# Patient Record
Sex: Male | Born: 1950 | Race: White | Hispanic: No | Marital: Married | Smoking: Never smoker
Health system: Southern US, Community
[De-identification: ages and names within clinical notes are randomized; demographics above are authoritative.]

## PROBLEM LIST (undated history)

## (undated) DIAGNOSIS — E785 Hyperlipidemia, unspecified: Secondary | ICD-10-CM

## (undated) HISTORY — DX: Hyperlipidemia, unspecified: E78.5

---
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
## 2007-01-29 ENCOUNTER — Emergency Department: Payer: Self-pay | Admitting: Emergency Medicine

## 2007-02-04 DIAGNOSIS — M5417 Radiculopathy, lumbosacral region: Secondary | ICD-10-CM | POA: Insufficient documentation

## 2007-02-07 ENCOUNTER — Ambulatory Visit: Payer: Self-pay | Admitting: Family Medicine

## 2010-11-11 ENCOUNTER — Ambulatory Visit: Payer: Self-pay

## 2010-11-20 ENCOUNTER — Emergency Department (HOSPITAL_COMMUNITY)
Admission: EM | Admit: 2010-11-20 | Discharge: 2010-11-20 | Disposition: A | Discharge status: Home / Self Care | Payer: Worker's Compensation | Attending: Emergency Medicine | Admitting: Emergency Medicine

## 2010-11-20 ENCOUNTER — Emergency Department (HOSPITAL_COMMUNITY): Payer: Worker's Compensation

## 2010-11-20 DIAGNOSIS — S6990XA Unspecified injury of unspecified wrist, hand and finger(s), initial encounter: Secondary | ICD-10-CM | POA: Insufficient documentation

## 2010-11-20 DIAGNOSIS — Y990 Civilian activity done for income or pay: Secondary | ICD-10-CM | POA: Insufficient documentation

## 2010-11-20 DIAGNOSIS — W230XXA Caught, crushed, jammed, or pinched between moving objects, initial encounter: Secondary | ICD-10-CM | POA: Insufficient documentation

## 2010-11-20 DIAGNOSIS — S61409A Unspecified open wound of unspecified hand, initial encounter: Secondary | ICD-10-CM | POA: Insufficient documentation

## 2011-02-05 ENCOUNTER — Emergency Department: Payer: Self-pay | Admitting: Emergency Medicine

## 2011-06-25 ENCOUNTER — Ambulatory Visit: Payer: Worker's Compensation

## 2011-06-25 ENCOUNTER — Other Ambulatory Visit: Payer: Self-pay | Admitting: Occupational Medicine

## 2011-06-25 DIAGNOSIS — R52 Pain, unspecified: Secondary | ICD-10-CM

## 2013-05-22 LAB — HM HEPATITIS C SCREENING LAB: HM HEPATITIS C SCREENING: NEGATIVE

## 2013-12-18 ENCOUNTER — Ambulatory Visit: Payer: Self-pay | Admitting: Gastroenterology

## 2013-12-18 LAB — HM COLONOSCOPY

## 2015-08-15 ENCOUNTER — Ambulatory Visit (INDEPENDENT_AMBULATORY_CARE_PROVIDER_SITE_OTHER): Payer: Medicare Other

## 2015-08-15 ENCOUNTER — Other Ambulatory Visit (INDEPENDENT_AMBULATORY_CARE_PROVIDER_SITE_OTHER): Payer: Medicare Other

## 2015-08-15 DIAGNOSIS — Z23 Encounter for immunization: Secondary | ICD-10-CM

## 2016-01-13 ENCOUNTER — Encounter: Payer: Self-pay | Admitting: Family Medicine

## 2016-01-13 ENCOUNTER — Ambulatory Visit (INDEPENDENT_AMBULATORY_CARE_PROVIDER_SITE_OTHER): Payer: Medicare Other | Admitting: Family Medicine

## 2016-01-13 VITALS — BP 122/82 | HR 67 | Temp 98.0°F | Resp 14 | Wt 258.4 lb

## 2016-01-13 DIAGNOSIS — N528 Other male erectile dysfunction: Secondary | ICD-10-CM | POA: Diagnosis not present

## 2016-01-13 DIAGNOSIS — L0591 Pilonidal cyst without abscess: Secondary | ICD-10-CM | POA: Diagnosis not present

## 2016-01-13 DIAGNOSIS — R0683 Snoring: Secondary | ICD-10-CM | POA: Insufficient documentation

## 2016-01-13 DIAGNOSIS — N429 Disorder of prostate, unspecified: Secondary | ICD-10-CM | POA: Insufficient documentation

## 2016-01-13 DIAGNOSIS — E785 Hyperlipidemia, unspecified: Secondary | ICD-10-CM

## 2016-01-13 DIAGNOSIS — N529 Male erectile dysfunction, unspecified: Secondary | ICD-10-CM

## 2016-01-13 MED ORDER — SILDENAFIL CITRATE 20 MG PO TABS: ORAL_TABLET | ORAL | Status: DC

## 2016-01-13 MED ORDER — CEPHALEXIN 500 MG PO CAPS: 500.0000 mg | ORAL_CAPSULE | Freq: Three times a day (TID) | ORAL | Status: DC

## 2016-01-13 NOTE — Progress Notes (Signed)
Patient ID: Walter [name], male   DOB: June 29, 1951, 66 y.o.   MRN: [id number]   Patient: Walter [name] Male    DOB: April 21, 1951   65 y.o.   MRN: [id number] Visit Date: 01/13/2016  Today's Provider: Dortha Kern, PA   Chief Complaint  Patient presents with  . Rash   Subjective:    Rash This is a new problem. The current episode started yesterday. The problem is unchanged. Location: lower mid back. The rash is characterized by redness and swelling (tenderness). He was exposed to an insect bite/sting. Associated symptoms include fatigue. Past treatments include nothing.  Some insect bites (possible small ticks) over the past 5-10 days.   Immunization History  Administered Date(s) Administered  . Influenza, High Dose Seasonal PF 08/15/2015  . Pneumococcal Conjugate-13 08/15/2015    Patient Active Problem List   Diagnosis Date Noted  . Disease of prostate 01/13/2016  . Snores 01/13/2016  . Herpes zoster without complication 04/25/2010  . 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 Colon, diverticulosis 12/13/2006   Past Surgical History  Procedure Laterality Date  . Tonsillectomy and adenoidectomy  1960   Family History  Problem Relation Age of Onset  . Hyperthyroidism Mother   . Lung cancer Father   . Heart attack Father   . Hypothyroidism Sister   . Coronary artery disease Brother   . Nephrolithiasis Brother   . Heart attack Maternal Grandfather   . Heart disease Paternal Grandmother   . Emphysema Paternal Grandfather     Previous Medications   CINNAMON 500 MG CAPSULE    Take by mouth.   COENZYME Q10 (COQ-10) 10 MG CAPS    Take by mouth.   CYANOCOBALAMIN 100 MCG TABLET    Take by mouth.   MAGNESIUM PO    Take by mouth.   NAPROXEN SODIUM (ALEVE) 220 MG TABLET    Take by mouth.   OMEGA-3 FATTY ACIDS PO    Take by mouth.   RED YEAST RICE EXTRACT (RED YEAST RICE PO)    Take by mouth.   SILDENAFIL (REVATIO) 20 MG TABLET    Take by mouth.   UNABLE TO FIND    Med Name:osteo bio   VITAMIN C (ASCORBIC ACID) 500 MG TABLET    Take by mouth.   VITAMIN D, CHOLECALCIFEROL, 400 UNITS TABS    Take by mouth.   VITAMIN E 1000 UNIT CAPSULE    Take by mouth.   Allergies  Allergen Reactions  . Antihistamines, Chlorpheniramine-Type     Review of Systems  Constitutional: Positive for fatigue.  HENT: Negative.   Eyes: Negative.   Respiratory: Negative.   Cardiovascular: Negative.   Gastrointestinal: Negative.   Endocrine: Negative.   Genitourinary: Negative.   Musculoskeletal: Negative.   Skin: Positive for rash.  Allergic/Immunologic: Negative.   Neurological: Negative.   Hematological: Negative.   Psychiatric/Behavioral: Negative.     Social History  Substance Use Topics  . Smoking status: Never Smoker   . Smokeless tobacco: Never Used  . Alcohol Use: 0.0 oz/week    0 Standard drinks or equivalent per week     Comment: occasionally   Objective:   BP 122/82 mmHg  Pulse 67  Temp(Src) 98 F (36.7 C) (Oral)  Resp 14  Wt 258 lb 6.4 oz (117.209 kg)  Physical Exam  Constitutional: He is oriented to person, place, and time. He appears well-developed and well-nourished. No distress.  HENT:  Head: Normocephalic and atraumatic.  Right Ear: Hearing normal.  Left Ear: Hearing normal.  Nose: Nose normal.  Eyes: Conjunctivae and lids are normal. Right eye exhibits no discharge. Left eye exhibits no discharge. No scleral icterus.  Cardiovascular: Normal rate and regular rhythm.   Pulmonary/Chest: Effort normal and breath sounds normal. No respiratory distress.  Musculoskeletal: Normal range of motion.  Neurological: He is alert and oriented to person, place, and time.  Skin: Skin is intact. No lesion noted.  Small insect bites on right leg. Hot swelling with erythema just above buttock cleft over sacrum. Some tenderness without drainage or dimpling.  Psychiatric: He has a  normal mood and affect. His speech is normal and behavior is normal. Thought content normal.      Assessment & Plan:     1. Infected pilonidal cyst Onset over the past few days without fever or drainage. Will check CBC with differential and start Keflex for infection. Recommend hot Epsom Saltwater soaks at home and recheck pending lab reports. - CBC with Differential/Platelet - cephALEXin (KEFLEX) 500 MG capsule; Take 1 capsule (500 mg total) by mouth 3 (three) times daily.  Dispense: 21 capsule; Refill: 0  2. Hyperlipidemia Still using Red Yeast Rice and Fish Oil supplement with attempts to follow low fat diet. Recheck CMP and Lipid panel. Due to schedule CPE in the next 2-3 months. - Comprehensive metabolic panel - Lipid panel  3. ED (erectile dysfunction) of organic origin Requests refill of sildenafil. Finds it is becoming less effective in helping with maintaining or attaining erections. Will refill as below and recommend he schedule for CPE. - sildenafil (REVATIO) 20 MG tablet; Take 2-3 tablets by mouth 1-4 hours prior to intercourse.  Dispense: 30 tablet; Refill: 3

## 2016-01-13 NOTE — Patient Instructions (Signed)
Pilonidal Cyst  A pilonidal cyst is a fluid-filled sac. It forms beneath the skin near your tailbone, at the top of the crease of your buttocks. A pilonidal cyst that is not large or infected may not cause symptoms or problems.  If the cyst becomes irritated or infected, it may fill with pus. This causes pain and swelling (pilonidal abscess). An infected cyst may need to be treated with medicine, drained, or removed.  CAUSES  The cause of a pilonidal cyst is not known. One cause may be a hair that grows into your skin (ingrown hair).  RISK FACTORS  Pilonidal cysts are more common in boys and men. Risk factors include:  · Having lots of hair near the crease of the buttocks.  · Being overweight.  · Having a pilonidal dimple.  · Wearing tight clothing.  · Not bathing or showering frequently.  · Sitting for long periods of time.  SIGNS AND SYMPTOMS  Signs and symptoms of a pilonidal cyst may include:  · Redness.  · Pain and tenderness.  · Warmth.  · Swelling.  · Pus.  · Fever.  DIAGNOSIS  Your health care provider may diagnose a pilonidal cyst based on your symptoms and a physical exam. The health care provider may do a blood test to check for infection. If your cyst is draining pus, your health care provider may take a sample of the drainage to be tested at a laboratory.  TREATMENT  Surgery is the usual treatment for an infected pilonidal cyst. You may also have to take medicines before surgery. The type of surgery you have depends on the size and severity of the infected cyst. The different kinds of surgery include:  · Incision and drainage. This is a procedure to open and drain the cyst.  · Marsupialization. In this procedure, a large cyst or abscess may be opened and kept open by stitching the edges of the skin to the cyst walls.  · Cyst removal. This procedure involves opening the skin and removing all or part of the cyst.  HOME CARE INSTRUCTIONS  · Follow all of your surgeon's instructions carefully if you had  surgery.  · Take medicines only as directed by your health care provider.  · If you were prescribed an antibiotic medicine, finish it all even if you start to feel better.  · Keep the area around your pilonidal cyst clean and dry.  · Clean the area as directed by your health care provider. Pat the area dry with a clean towel. Do not rub it as this may cause bleeding.  · Remove hair from the area around the cyst as directed by your health care provider.  · Do not wear tight clothing or sit in one place for long periods of time.  · There are many different ways to close and cover an incision, including stitches, skin glue, and adhesive strips. Follow your health care provider's instructions on:    Incision care.    Bandage (dressing) changes and removal.    Incision closure removal.  SEEK MEDICAL CARE IF:   · You have drainage, redness, swelling, or pain at the site of the cyst.  · You have a fever.     This information is not intended to replace advice given to you by your health care provider. Make sure you discuss any questions you have with your health care provider.     Document Released: 09/11/2000 Document Revised: 10/05/2014 Document Reviewed: 02/01/2014  Elsevier Interactive Patient   Education ©2016 Else[employer] Inc[employer]  -

## 2016-01-14 ENCOUNTER — Telehealth: Payer: Self-pay

## 2016-01-14 LAB — CBC WITH DIFFERENTIAL/PLATELET
BASOS ABS: 0 10*3/uL (ref 0.0–0.2)
BASOS: 1 %
EOS (ABSOLUTE): 0.3 10*3/uL (ref 0.0–0.4)
Eos: 6 %
Hematocrit: 45 % (ref 37.5–51.0)
Hemoglobin: 15.5 g/dL (ref 12.6–17.7)
IMMATURE GRANS (ABS): 0 10*3/uL (ref 0.0–0.1)
IMMATURE GRANULOCYTES: 0 %
LYMPHS: 31 %
Lymphocytes Absolute: 1.7 10*3/uL (ref 0.7–3.1)
MCH: 32 pg (ref 26.6–33.0)
MCHC: 34.4 g/dL (ref 31.5–35.7)
MCV: 93 fL (ref 79–97)
MONOS ABS: 0.8 10*3/uL (ref 0.1–0.9)
Monocytes: 14 %
NEUTROS PCT: 48 %
Neutrophils Absolute: 2.6 10*3/uL (ref 1.4–7.0)
PLATELETS: 205 10*3/uL (ref 150–379)
RBC: 4.84 x10E6/uL (ref 4.14–5.80)
RDW: 13.1 % (ref 12.3–15.4)
WBC: 5.5 10*3/uL (ref 3.4–10.8)

## 2016-01-14 LAB — COMPREHENSIVE METABOLIC PANEL
A/G RATIO: 1.8 (ref 1.2–2.2)
ALT: 22 IU/L (ref 0–44)
AST: 25 IU/L (ref 0–40)
Albumin: 4.6 g/dL (ref 3.6–4.8)
Alkaline Phosphatase: 77 IU/L (ref 39–117)
BILIRUBIN TOTAL: 0.7 mg/dL (ref 0.0–1.2)
BUN/Creatinine Ratio: 13 (ref 10–24)
BUN: 11 mg/dL (ref 8–27)
CALCIUM: 9.2 mg/dL (ref 8.6–10.2)
CHLORIDE: 99 mmol/L (ref 96–106)
CO2: 22 mmol/L (ref 18–29)
Creatinine, Ser: 0.84 mg/dL (ref 0.76–1.27)
GFR calc Af Amer: 106 mL/min/{1.73_m2} (ref >59)
GFR calc non Af Amer: 92 mL/min/{1.73_m2} (ref >59)
GLUCOSE: 88 mg/dL (ref 65–99)
Globulin, Total: 2.5 g/dL (ref 1.5–4.5)
POTASSIUM: 5.6 mmol/L — AB (ref 3.5–5.2)
Sodium: 138 mmol/L (ref 134–144)
Total Protein: 7.1 g/dL (ref 6.0–8.5)

## 2016-01-14 LAB — LIPID PANEL
Chol/HDL Ratio: 3.1 ratio units (ref 0.0–5.0)
Cholesterol, Total: 166 mg/dL (ref 100–199)
HDL: 53 mg/dL (ref >39)
LDL Calculated: 105 mg/dL — ABNORMAL HIGH (ref 0–99)
TRIGLYCERIDES: 41 mg/dL (ref 0–149)
VLDL CHOLESTEROL CAL: 8 mg/dL (ref 5–40)

## 2016-01-14 NOTE — Telephone Encounter (Signed)
Advised patient as below. He will call back to schedule appt.

## 2016-01-14 NOTE — Telephone Encounter (Signed)
Left message to call back  

## 2016-01-14 NOTE — Telephone Encounter (Signed)
-----   Message from Tamsen Roersennis E Chrismon, [place] sent at 01/14/2016  1:24 PM EDT ----- All blood tests normal except potassium slightly elevated. Normal cholesterol and no sign of anemia. Finish all the antibiotic. Recheck in 4-5 days to be sure infection clearing.

## 2016-01-17 ENCOUNTER — Encounter: Payer: Self-pay | Admitting: Family Medicine

## 2016-01-17 ENCOUNTER — Ambulatory Visit (INDEPENDENT_AMBULATORY_CARE_PROVIDER_SITE_OTHER): Payer: Medicare Other | Admitting: Family Medicine

## 2016-01-17 VITALS — BP 130/80 | HR 68 | Temp 97.9°F | Resp 14 | Wt 253.8 lb

## 2016-01-17 DIAGNOSIS — L0591 Pilonidal cyst without abscess: Secondary | ICD-10-CM

## 2016-01-17 NOTE — Progress Notes (Signed)
Patient ID: Walter [name], male   DOB: 1951/01/25, 65 y.o.   MRN: [id number]   Patient: Walter [name] Male    DOB: 1951/01/25   65 y.o.   MRN: [id number] Visit Date: 01/17/2016  Today's Provider: Dortha Kernennis Chrismon, PA   Chief Complaint  Patient presents with  . Follow-up   Subjective:    HPI Patient presents for a 4 day follow up. Patient was seen on 01/13/2016 and diagnosed with a infected pilonidal cyst. Patient was prescribed Cephalexin. Patient reports symptoms have improved. Patient is still taking antibiotics.    Previous Medications   CEPHALEXIN (KEFLEX) 500 MG CAPSULE    Take 1 capsule (500 mg total) by mouth 3 (three) times daily.   CINNAMON 500 MG CAPSULE    Take by mouth.   COENZYME Q10 (COQ-10) 10 MG CAPS    Take by mouth.   CYANOCOBALAMIN 100 MCG TABLET    Take by mouth.   MAGNESIUM PO    Take by mouth.   NAPROXEN SODIUM (ALEVE) 220 MG TABLET    Take by mouth.   OMEGA-3 FATTY ACIDS PO    Take by mouth.   RED YEAST RICE EXTRACT (RED YEAST RICE PO)    Take by mouth.   SILDENAFIL (REVATIO) 20 MG TABLET    Take 2-3 tablets by mouth 1-4 hours prior to intercourse.   UNABLE TO FIND    Med Name:osteo bio   VITAMIN C (ASCORBIC ACID) 500 MG TABLET    Take by mouth.   VITAMIN D, CHOLECALCIFEROL, 400 UNITS TABS    Take by mouth.   VITAMIN E 1000 UNIT CAPSULE    Take by mouth.   Allergies  Allergen Reactions  . Antihistamines, Chlorpheniramine-Type     Review of Systems  Constitutional: Negative.   HENT: Negative.   Eyes: Negative.   Respiratory: Negative.   Cardiovascular: Negative.   Gastrointestinal: Negative.   Endocrine: Negative.   Genitourinary: Negative.   Musculoskeletal: Negative.   Skin:       Pilonidal cyst   Allergic/Immunologic: Negative.   Neurological: Negative.   Hematological: Negative.   Psychiatric/Behavioral: Negative.     Social History  Substance Use Topics  . Smoking status: Never Smoker   . Smokeless tobacco: Never Used  . Alcohol Use:  0.0 oz/week    0 Standard drinks or equivalent per week     Comment: occasionally   Objective:   BP 130/80 mmHg  Pulse 68  Temp(Src) 97.9 F (36.6 C) (Oral)  Resp 14  Wt 253 lb 12.8 oz (115.123 kg)  Physical Exam  Constitutional: He is oriented to person, place, and time. He appears well-developed and well-nourished. No distress.  HENT:  Head: Normocephalic and atraumatic.  Right Ear: Hearing normal.  Left Ear: Hearing normal.  Nose: Nose normal.  Eyes: Conjunctivae and lids are normal. Right eye exhibits no discharge. Left eye exhibits no discharge. No scleral icterus.  Pulmonary/Chest: Effort normal. No respiratory distress.  Musculoskeletal: Normal range of motion.  Neurological: He is alert and oriented to person, place, and time.  Skin: Skin is intact. No lesion and no rash noted.  Psychiatric: He has a normal mood and affect. His speech is normal and behavior is normal. Thought content normal.      Assessment & Plan:     1. Infected pilonidal cyst Greatly improved and to palpable lesion now. All the redness, pain and swelling gone since using Keflex. Finish all the antibiotic and recheck prn.

## 2016-01-20 ENCOUNTER — Ambulatory Visit (INDEPENDENT_AMBULATORY_CARE_PROVIDER_SITE_OTHER): Payer: Medicare Other | Admitting: Family Medicine

## 2016-01-20 ENCOUNTER — Encounter: Payer: Self-pay | Admitting: Family Medicine

## 2016-01-20 VITALS — BP 134/86 | HR 67 | Temp 97.9°F | Resp 14 | Wt 256.0 lb

## 2016-01-20 DIAGNOSIS — T148 Other injury of unspecified body region: Secondary | ICD-10-CM | POA: Diagnosis not present

## 2016-01-20 DIAGNOSIS — W57XXXA Bitten or stung by nonvenomous insect and other nonvenomous arthropods, initial encounter: Secondary | ICD-10-CM | POA: Diagnosis not present

## 2016-01-20 NOTE — Progress Notes (Signed)
Patient ID: Walter [name], male   DOB: 05-15-1951, 65 y.o.   MRN: [id number]   Patient: Walter [name] Male    DOB: 11/09/1950   65 y.o.   MRN: [id number] Visit Date: 01/20/2016  Today's Provider: Dortha Kern, PA   Chief Complaint  Patient presents with  . Insect Bite   Subjective:    HPI Tick Bite:   Patient reports removing a tick from right side groin area. Patient states area is red with a bump now. Patient wants to be sure the tick's whole body was removed. Patient denies itching, pain, fever and headache.   Previous Medications   CEPHALEXIN (KEFLEX) 500 MG CAPSULE    Take 1 capsule (500 mg total) by mouth 3 (three) times daily.   CINNAMON 500 MG CAPSULE    Take by mouth.   COENZYME Q10 (COQ-10) 10 MG CAPS    Take by mouth.   CYANOCOBALAMIN 100 MCG TABLET    Take by mouth.   MAGNESIUM PO    Take by mouth.   NAPROXEN SODIUM (ALEVE) 220 MG TABLET    Take by mouth.   OMEGA-3 FATTY ACIDS PO    Take by mouth.   RED YEAST RICE EXTRACT (RED YEAST RICE PO)    Take by mouth.   SILDENAFIL (REVATIO) 20 MG TABLET    Take 2-3 tablets by mouth 1-4 hours prior to intercourse.   UNABLE TO FIND    Med Name:osteo bio   VITAMIN C (ASCORBIC ACID) 500 MG TABLET    Take by mouth.   VITAMIN D, CHOLECALCIFEROL, 400 UNITS TABS    Take by mouth.   VITAMIN E 1000 UNIT CAPSULE    Take by mouth.   Allergies  Allergen Reactions  . Antihistamines, Chlorpheniramine-Type     Review of Systems  Constitutional: Negative.   HENT: Negative.   Eyes: Negative.   Respiratory: Negative.   Cardiovascular: Negative.   Gastrointestinal: Negative.   Endocrine: Negative.   Genitourinary: Negative.   Musculoskeletal: Negative.   Skin: Negative.   Allergic/Immunologic: Negative.   Neurological: Negative.   Hematological: Negative.   Psychiatric/Behavioral: Negative.     Social History  Substance Use Topics  . Smoking status: Never Smoker   . Smokeless tobacco: Never Used  . Alcohol Use: 0.0 oz/week     0 Standard drinks or equivalent per week     Comment: occasionally   Objective:   BP 134/86 mmHg  Pulse 67  Temp(Src) 97.9 F (36.6 C) (Oral)  Resp 14  Wt 256 lb (116.121 kg)  Physical Exam  Constitutional: He is oriented to person, place, and time. He appears well-developed and well-nourished. No distress.  HENT:  Head: Normocephalic and atraumatic.  Right Ear: Hearing normal.  Left Ear: Hearing normal.  Nose: Nose normal.  Eyes: Conjunctivae and lids are normal. Right eye exhibits no discharge. Left eye exhibits no discharge. No scleral icterus.  Pulmonary/Chest: Effort normal. No respiratory distress.  Musculoskeletal: Normal range of motion.  Neurological: He is alert and oriented to person, place, and time.  Skin: Skin is intact. No lesion noted.  1 cm red spot on the right groin without local lymphadenopathy.  Psychiatric: He has a normal mood and affect. His speech is normal and behavior is normal. Thought content normal.      Assessment & Plan:     1. Tick bite  Found tick attached to skin in right groin two days ago. No fever or local lymphadenopathy. Slight red spot  a bite site. Cleaned it well with alcohol and no evidence of tick parts still in skin. Brought tick with him and all mouth parts seem to be intact. Recheck if any flu-like symptoms.

## 2016-01-20 NOTE — Patient Instructions (Signed)
Tick Bite Information Ticks are insects that attach themselves to the skin and draw blood for food. There are various types of ticks. Common types include wood ticks and deer ticks. Most ticks live in shrubs and grassy areas. Ticks can climb onto your body when you make contact with leaves or grass where the tick is waiting. The most common places on the body for ticks to attach themselves are the scalp, neck, armpits, waist, and groin. Most tick bites are harmless, but sometimes ticks carry germs that cause diseases. These germs can be spread to a person during the tick's feeding process. The chance of a disease spreading through a tick bite depends on:   The type of tick.  Time of year.   How long the tick is attached.   Geographic location.  HOW CAN YOU PREVENT TICK BITES? Take these steps to help prevent tick bites when you are outdoors:  Wear protective clothing. Long sleeves and long pants are best.   Wear white clothes so you can see ticks more easily.  Tuck your pant legs into your socks.   If walking on a trail, stay in the middle of the trail to avoid brushing against bushes.  Avoid walking through areas with long grass.  Put insect repellent on all exposed skin and along boot tops, pant legs, and sleeve cuffs.   Check clothing, hair, and skin repeatedly and before going inside.   Brush off any ticks that are not attached.  Take a shower or bath as soon as possible after being outdoors.  WHAT IS THE PROPER WAY TO REMOVE A TICK? Ticks should be removed as soon as possible to help prevent diseases caused by tick bites. 1. If latex gloves are available, put them on before trying to remove a tick.  2. Using fine-point tweezers, grasp the tick as close to the skin as possible. You may also use curved forceps or a tick removal tool. Grasp the tick as close to its head as possible. Avoid grasping the tick on its body. 3. Pull gently with steady upward pressure until  the tick lets go. Do not twist the tick or jerk it suddenly. This may break off the tick's head or mouth parts. 4. Do not squeeze or crush the tick's body. This could force disease-carrying fluids from the tick into your body.  5. After the tick is removed, wash the bite area and your hands with soap and water or other disinfectant such as alcohol. 6. Apply a small amount of antiseptic cream or ointment to the bite site.  7. Wash and disinfect any instruments that were used.  Do not try to remove a tick by applying a hot match, petroleum jelly, or fingernail polish to the tick. These methods do not work and may increase the chances of disease being spread from the tick bite.  WHEN SHOULD YOU SEEK MEDICAL CARE? Contact your health care provider if you are unable to remove a tick from your skin or if a part of the tick breaks off and is stuck in the skin.  After a tick bite, you need to be aware of signs and symptoms that could be related to diseases spread by ticks. Contact your health care provider if you develop any of the following in the days or weeks after the tick bite:  Unexplained fever.  Rash. A circular rash that appears days or weeks after the tick bite may indicate the possibility of Lyme disease. The rash may resemble   a target with a bull's-eye and may occur at a different part of your body than the tick bite.  Redness and swelling in the area of the tick bite.   Tender, swollen lymph glands.   Diarrhea.   Weight loss.   Cough.   Fatigue.   Muscle, joint, or bone pain.   Abdominal pain.   Headache.   Lethargy or a change in your level of consciousness.  Difficulty walking or moving your legs.   Numbness in the legs.   Paralysis.  Shortness of breath.   Confusion.   Repeated vomiting.    This information is not intended to replace advice given to you by your health care provider. Make sure you discuss any questions you have with your health  care provider.   Document Released: 09/11/2000 Document Revised: 10/05/2014 Document Reviewed: 02/22/2013 Elsevier Interactive Patient Education 2016 Elsevier Inc.  

## 2016-05-01 ENCOUNTER — Encounter: Payer: Self-pay | Admitting: Family Medicine

## 2016-05-01 ENCOUNTER — Ambulatory Visit (INDEPENDENT_AMBULATORY_CARE_PROVIDER_SITE_OTHER): Payer: Medicare Other | Admitting: Family Medicine

## 2016-05-01 VITALS — BP 132/70 | HR 80 | Temp 98.3°F | Resp 16 | Wt 253.0 lb

## 2016-05-01 DIAGNOSIS — S61432A Puncture wound without foreign body of left hand, initial encounter: Secondary | ICD-10-CM

## 2016-05-01 DIAGNOSIS — Z23 Encounter for immunization: Secondary | ICD-10-CM

## 2016-05-01 MED ORDER — TETANUS-DIPHTHERIA TOXOIDS TD 5-2 LFU IM INJ: 0.5000 mL | INJECTION | Freq: Once | INTRAMUSCULAR | Status: DC

## 2016-05-01 NOTE — Progress Notes (Signed)
Patient: Walter [name] Male    DOB: [birth year]   65 y.o.   MRN: [id number] Visit Date: 05/01/2016  Today's Provider: Dortha Kern, PA   Chief Complaint  Patient presents with  . rusty nail in right hand   Subjective:    HPI Patient comes in today c/o a rusty nail puncturing his right hand. Patient reports that he is unsure of when his last tetanus shot was. Patient would like to update tetanus shot today.    No past medical history on file. Patient Active Problem List   Diagnosis Date Noted  . Disease of prostate 01/13/2016  . Snores 01/13/2016  . Hyperlipidemia 01/13/2016  . Herpes zoster without complication 04/25/2010  . 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 Colon, diverticulosis 12/13/2006   Past Surgical History:  Procedure Laterality Date  . TONSILLECTOMY AND ADENOIDECTOMY  1960    Allergies  Allergen Reactions  . Antihistamines, Chlorpheniramine-Type    Current Meds  Medication Sig  . Cinnamon 500 MG capsule Take by mouth.  . Coenzyme Q10 (COQ-10) 10 MG CAPS Take by mouth.  . cyanocobalamin 100 MCG tablet Take by mouth.  [name] MAGNESIUM PO Take by mouth.  . naproxen sodium (ALEVE) 220 MG tablet Take by mouth.  . OMEGA-3 FATTY ACIDS PO Take by mouth.  . Red Yeast Rice Extract (RED YEAST RICE PO) Take by mouth.  . vitamin C (ASCORBIC ACID) 500 MG tablet Take by mouth.  . Vitamin D, Cholecalciferol, 400 units TABS Take by mouth.  . vitamin E 1000 UNIT capsule Take by mouth.    Review of Systems  Constitutional: Negative.   Respiratory: Negative.   Cardiovascular: Negative.   Skin: Positive for color change.       Redness around the area where nail punctured the skin (on right hand).   Neurological: Negative.     Social History  Substance Use Topics  . Smoking status: Never Smoker  . Smokeless tobacco: Never Used  . Alcohol use 0.0 oz/week     Comment:  occasionally   Objective:   BP 132/70 (BP Location: Right Arm, Patient Position: Sitting, Cuff Size: Normal)   Pulse 80   Temp 98.3 F (36.8 C)   Resp 16   Wt 253 lb (114.8 kg)  Wt Readings from Last 3 Encounters:  05/01/16 253 lb (114.8 kg)  01/20/16 256 lb (116.1 kg)  01/17/16 253 lb 12.8 oz (115.1 kg)    Physical Exam  Constitutional: He is oriented to person, place, and time. He appears well-developed and well-nourished. No distress.  HENT:  Head: Normocephalic and atraumatic.  Right Ear: Hearing normal.  Left Ear: Hearing normal.  Nose: Nose normal.  Eyes: Conjunctivae and lids are normal. Right eye exhibits no discharge. Left eye exhibits no discharge. No scleral icterus.  Cardiovascular: Normal rate and regular rhythm.   Pulmonary/Chest: Effort normal and breath sounds normal. No respiratory distress.  Abdominal: Soft. Bowel sounds are normal.  Musculoskeletal: Normal range of motion.  Neurological: He is alert and oriented to person, place, and time.  Skin: Skin is intact. No lesion and no rash noted.  Small puncture wounds on the right hand from rusty nails on the front porch at home.  Psychiatric: He has a normal mood and affect. His speech is normal and behavior is normal. Thought content normal.      Assessment & Plan:  1. Puncture wound of left hand, initial encounter Stuck rusty nail in the right hand twice today at home working on the railing of his front porch. No local lymphadenopathy, lymphangitis or purulent drainage. Will give Td booster and may use warm Epsom Saltwater soaks. Recheck prn. - Tetanus vaccine IM       Dortha Kern, PA  [facility]

## 2016-05-01 NOTE — Patient Instructions (Signed)
Puncture Wound A puncture wound is an injury that is caused by a sharp, thin object that goes through (penetrates) your skin. Usually, a puncture wound does not leave a large opening in your skin, so it may not bleed a lot. However, when you get a puncture wound, dirt or other materials (foreign bodies) can be forced into your wound and break off inside. This increases the chance of infection, such as tetanus. CAUSES Puncture wounds are caused by any sharp, thin object that goes through your skin, such as:  Animal teeth, as with an animal bite.  Sharp, pointed objects, such as nails, splinters of glass, fishhooks, and needles. SYMPTOMS Symptoms of a puncture wound include:  Pain.  Bleeding.  Swelling.  Bruising.  Fluid leaking from the wound.  Numbness, tingling, or loss of function. DIAGNOSIS This condition is diagnosed with a medical history and physical exam. Your wound will be checked to see if it contains any foreign bodies. You may also have X-rays or other imaging tests. TREATMENT Treatment for a puncture wound depends on how serious the wound is. It also depends on whether the wound contains any foreign bodies. Treatment for all types of puncture wounds usually starts with:  Controlling the bleeding.  Washing out the wound with a germ-free (sterile) salt-water solution.  Checking the wound for foreign bodies. Treatment may also include:  Having the wound opened surgically to remove a foreign object.  Closing the wound with stitches (sutures) if it continues to bleed.  Covering the wound with antibiotic ointments and a bandage (dressing).  Receiving a tetanus shot.  Receiving a rabies vaccine. HOME CARE INSTRUCTIONS Medicines  Take or apply over-the-counter and prescription medicines only as told by your health care provider.  If you were prescribed an antibiotic, take or apply it as told by your health care provider. Do not stop using the antibiotic even if  your condition improves. Wound Care  There are many ways to close and cover a wound. For example, a wound can be covered with sutures, skin glue, or adhesive strips. Follow instructions from your health care provider about:  How to take care of your wound.  When and how you should change your dressing.  When you should remove your dressing.  Removing whatever was used to close your wound.  Keep the dressing dry as told by your health care provider. Do not take baths, swim, use a hot tub, or do anything that would put your wound underwater until your health care provider approves.  Clean the wound as told by your health care provider.  Do not scratch or pick at the wound.  Check your wound every day for signs of infection. Watch for:  Redness, swelling, or pain.  Fluid, blood, or pus. General Instructions  Raise (elevate) the injured area above the level of your heart while you are sitting or lying down.  If your puncture wound is in your foot, ask your health care provider if you need to avoid putting weight on your foot and for how long.  Keep all follow-up visits as told by your health care provider. This is important. SEEK MEDICAL CARE IF:  You received a tetanus shot and you have swelling, severe pain, redness, or bleeding at the injection site.  You have a fever.  Your sutures come out.  You notice a bad smell coming from your wound or your dressing.  You notice something coming out of your wound, such as wood or glass.  Your   pain is not controlled with medicine.  You have increased redness, swelling, or pain at the site of your wound.  You have fluid, blood, or pus coming from your wound.  You notice a change in the color of your skin near your wound.  You need to change the dressing frequently due to fluid, blood, or pus draining from your wound.  You develop a new rash.  You develop numbness around your wound. SEEK IMMEDIATE MEDICAL CARE IF:  You  develop severe swelling around your wound.  Your pain suddenly increases and is severe.  You develop painful skin lumps.  You have a red streak going away from your wound.  The wound is on your hand or foot and you cannot properly move a finger or toe.  The wound is on your hand or foot and you notice that your fingers or toes look pale or bluish.   This information is not intended to replace advice given to you by your health care provider. Make sure you discuss any questions you have with your health care provider.   Document Released: 06/24/2005 Document Revised: 06/05/2015 Document Reviewed: 11/07/2014 Elsevier Interactive Patient Education 2016 [employer] Inc[employer]  

## 2016-06-25 ENCOUNTER — Ambulatory Visit (INDEPENDENT_AMBULATORY_CARE_PROVIDER_SITE_OTHER): Payer: Medicare Other | Admitting: Family Medicine

## 2016-06-25 DIAGNOSIS — Z23 Encounter for immunization: Secondary | ICD-10-CM | POA: Diagnosis not present

## 2016-08-13 ENCOUNTER — Ambulatory Visit (INDEPENDENT_AMBULATORY_CARE_PROVIDER_SITE_OTHER): Payer: Medicare Other | Admitting: Family Medicine

## 2016-08-13 VITALS — BP 136/72 | HR 64 | Temp 97.9°F | Ht 72.25 in | Wt 251.5 lb

## 2016-08-13 DIAGNOSIS — Z0000 Encounter for general adult medical examination without abnormal findings: Secondary | ICD-10-CM | POA: Diagnosis not present

## 2016-08-13 NOTE — Progress Notes (Signed)
Subjective:   Walter [name] is a 65 y.o. male who presents for Medicare Annual/Subsequent preventive examination.  Review of Systems:  N/A  Cardiac Risk Factors include: advanced age (>5655men, 3>65 women);dyslipidemia;male gender;obesity (BMI >30kg/m2)     Objective:    Vitals: BP 136/72 (BP Location: Right Arm)   Pulse 64   Temp 97.9 F (36.6 C) (Oral)   Ht 6' 0.25" (1.835 m)   Wt 251 lb 8 oz (114.1 kg)   BMI 33.87 kg/m   Body mass index is 33.87 kg/m.  Tobacco History  Smoking Status  . Never Smoker  Smokeless Tobacco  . Never Used     Counseling given: Not Answered   History reviewed. No pertinent past medical history. Past Surgical History:  Procedure Laterality Date  . TONSILLECTOMY AND ADENOIDECTOMY  1960   Family History  Problem Relation Age of Onset  . Hyperthyroidism Mother   . Lung cancer Father   . Heart attack Father   . Hypothyroidism Sister   . Coronary artery disease Brother   . Nephrolithiasis Brother   . Heart attack Maternal Grandfather   . Heart disease Paternal Grandmother   . Emphysema Paternal Grandfather    History  Sexual Activity  . Sexual activity: Not on file    Outpatient Encounter Prescriptions as of 08/13/2016  Medication Sig  . ASHWAGANDHA PO Take by mouth.  . Cinnamon 500 MG capsule Take by mouth.  . Coenzyme Q10 (COQ-10) 10 MG CAPS Take by mouth.  . Cyanocobalamin (VITAMIN B-12) 2500 MCG SUBL Place 2 each under the tongue daily.  [name]. MAGNESIUM PO Take by mouth.  . milk thistle 175 MG tablet Take 175 mg by mouth daily.  . naproxen sodium (ALEVE) 220 MG tablet Take by mouth.  . OMEGA-3 FATTY ACIDS PO Take by mouth.  . Red Yeast Rice Extract (RED YEAST RICE PO) Take by mouth.  [name]. UNABLE TO FIND daily. Med Name:osteo bio   . vitamin C (ASCORBIC ACID) 500 MG tablet Take by mouth.   . Vitamin D, Cholecalciferol, 400 units TABS Take by mouth.   . vitamin E 1000 UNIT capsule Take by mouth.  . cyanocobalamin 100 MCG tablet  Take by mouth.  . sildenafil (REVATIO) 20 MG tablet Take 2-3 tablets by mouth 1-4 hours prior to intercourse. (Patient not taking: Reported on 08/13/2016)   No facility-administered encounter medications on file as of 08/13/2016.     Activities of Daily Living In your present state of health, do you have any difficulty performing the following activities: 08/13/2016  Hearing? N  Vision? N  Difficulty concentrating or making decisions? N  Walking or climbing stairs? N  Dressing or bathing? N  Doing errands, shopping? N  Preparing Food and eating ? N  Using the Toilet? N  In the past six months, have you accidently leaked urine? N  Do you have problems with loss of bowel control? N  Managing your Medications? N  Managing your Finances? N  Housekeeping or managing your Housekeeping? N  Some recent data might be hidden    Patient Care Team: Tamsen Roersennis E Chrismon, PA as PCP - General (Family Medicine)   Assessment:     Exercise Activities and Dietary recommendations Current Exercise Habits: The patient does not participate in regular exercise at present;Home exercise routine (yard work, remodeling home), Type of exercise: walking, Time (Minutes): > 60, Frequency (Times/Week): 7, Weekly Exercise (Minutes/Week): 0, Intensity: Mild  Goals    . Increase water intake  Starting 08/13/16, I will increase my water intake to 5-6 glasses a day.      Fall Risk Fall Risk  08/13/2016  Falls in the past year? No   Depression Screen PHQ 2/9 Scores 08/13/2016  PHQ - 2 Score 0    Cognitive Function     6CIT Screen 08/13/2016  What Year? 0 points  What month? 0 points  What time? 0 points  Count back from 20 0 points  Months in reverse 0 points  Repeat phrase 4 points  Total Score 4    Immunization History  Administered Date(s) Administered  . Influenza, High Dose Seasonal PF 08/15/2015, 06/25/2016  . Pneumococcal Conjugate-13 08/15/2015  . Tetanus 05/01/2016    Screening Tests Health Maintenance  Topic Date Due  . ZOSTAVAX  08/13/2017 (Originally 07/22/2010)  . Hepatitis C Screening  08/13/2026 (Originally June 13, 1951)  . PNA vac Low Risk Adult (2 of 2 - PPSV23) 08/14/2016  . COLONOSCOPY  12/19/2023  . TETANUS/TDAP  05/01/2026  . INFLUENZA VACCINE  Completed      Plan:  I have personally reviewed and addressed the Medicare Annual Wellness questionnaire and have noted the following in the patient's chart:  A. Medical and social history B. Use of alcohol, tobacco or illicit drugs  C. Current medications and supplements D. Functional ability and status E.  Nutritional status F.  Physical activity G. Advance directives H. List of other physicians I.  Hospitalizations, surgeries, and ER visits in previous 12 months J.  Vitals K. Screenings such as hearing and vision if needed, cognitive and depression L. Referrals and appointments - none  In addition, I have reviewed and discussed with patient certain preventive protocols, quality metrics, and best practice recommendations. A written personalized care plan for preventive services as well as general preventive health recommendations were provided to patient.  See attached scanned questionnaire for additional information.   Signed,  Hyacinth MeekerMckenzie Teresha Hanks, LPN Nurse Health Advisor  I have reviewed the health advisor's note and agree with documentation and plan.

## 2016-08-13 NOTE — Patient Instructions (Signed)
[name] , Thank you for taking time to come for your Medicare Wellness Visit. I appreciate your ongoing commitment to your health goals. Please review the following plan we discussed and let me know if I can assist you in the future.   These are the goals we discussed: Goals    . Increase water intake          Starting 08/13/16, I will increase my water intake to 5-6 glasses a day.       This is a list of the screening recommended for you and due dates:  Health Maintenance  Topic Date Due  . Shingles Vaccine  08/13/2017*  .  Hepatitis C: One time screening is recommended by Center for Disease Control  (CDC) for  adults born from [birth year] through 1965.   08/13/2026*  . Pneumonia vaccines (2 of 2 - PPSV23) 08/14/2016  . Colon Cancer Screening  12/19/2023  . Tetanus Vaccine  05/01/2026  . Flu Shot  Completed  *Topic was postponed. The date shown is not the original due date.   Preventive Care for Adults  A healthy lifestyle and preventive care can promote health and wellness. Preventive health guidelines for adults include the following key practices.  . A routine yearly physical is a good way to check with your health care provider about your health and preventive screening. It is a chance to share any concerns and updates on your health and to receive a thorough exam.  . Visit your dentist for a routine exam and preventive care every 6 months. Brush your teeth twice a day and floss once a day. Good oral hygiene prevents tooth decay and gum disease.  . The frequency of eye exams is based on your age, health, family medical history, use  of contact lenses, and other factors. Follow your health care provider's ecommendations for frequency of eye exams.  . Eat a healthy diet. Foods like vegetables, fruits, whole grains, low-fat dairy products, and lean protein foods contain the nutrients you need without too many calories. Decrease your intake of foods high in solid fats, added sugars, and  salt. Eat the right amount of calories for you. Get information about a proper diet from your health care provider, if necessary.  . Regular physical exercise is one of the most important things you can do for your health. Most adults should get at least 150 minutes of moderate-intensity exercise (any activity that increases your heart rate and causes you to sweat) each week. In addition, most adults need muscle-strengthening exercises on 2 or more days a week.  Silver Sneakers may be a benefit available to you. To determine eligibility, you may visit the website: www.silversneakers.com or contact program at [phone])[phone] Mon-Fri between 8AM-8PM.   . Maintain a healthy weight. The body mass index (BMI) is a screening tool to identify possible weight problems. It provides an estimate of body fat based on height and weight. Your health care provider can find your BMI and can help you achieve or maintain a healthy weight.   For adults 20 years and older: ? A BMI below 18.5 is considered underweight. ? A BMI of 18.5 to 24.9 is normal. ? A BMI of 25 to 29.9 is considered overweight. ? A BMI of 30 and above is considered obese.   . Maintain normal blood lipids and cholesterol levels by exercising and minimizing your intake of saturated fat. Eat a balanced diet with plenty of fruit and vegetables. Blood tests for lipids and  cholesterol should begin at age 71 and be repeated every 5 years. If your lipid or cholesterol levels are high, you are over 50, or you are at high risk for heart disease, you may need your cholesterol levels checked more frequently. Ongoing high lipid and cholesterol levels should be treated with medicines if diet and exercise are not working.  . If you smoke, find out from your health care provider how to quit. If you do not use tobacco, please do not start.  . If you choose to drink alcohol, please do not consume more than 2 drinks per day. One drink is considered to be 12 ounces  (355 mL) of beer, 5 ounces (148 mL) of wine, or 1.5 ounces (44 mL) of liquor.  . If you are 68-48 years old, ask your health care provider if you should take aspirin to prevent strokes.  . Use sunscreen. Apply sunscreen liberally and repeatedly throughout the day. You should seek shade when your shadow is shorter than you. Protect yourself by wearing long sleeves, pants, a wide-brimmed hat, and sunglasses year round, whenever you are outdoors.  . Once a month, do a whole body skin exam, using a mirror to look at the skin on your back. Tell your health care provider of new moles, moles that have irregular borders, moles that are larger than a pencil eraser, or moles that have changed in shape or color.

## 2017-07-08 ENCOUNTER — Ambulatory Visit (INDEPENDENT_AMBULATORY_CARE_PROVIDER_SITE_OTHER): Payer: Medicare Other

## 2017-07-08 DIAGNOSIS — Z23 Encounter for immunization: Secondary | ICD-10-CM | POA: Diagnosis not present

## 2017-08-12 ENCOUNTER — Ambulatory Visit (INDEPENDENT_AMBULATORY_CARE_PROVIDER_SITE_OTHER): Payer: Medicare Other

## 2017-08-12 VITALS — BP 140/86 | HR 72 | Temp 98.1°F | Ht 72.0 in | Wt 247.2 lb

## 2017-08-12 DIAGNOSIS — Z0000 Encounter for general adult medical examination without abnormal findings: Secondary | ICD-10-CM

## 2017-08-12 NOTE — Patient Instructions (Signed)
[name] , Thank you for taking time to come for your Medicare Wellness Visit. I appreciate your ongoing commitment to your health goals. Please review the following plan we discussed and let me know if I can assist you in the future.   Screening recommendations/referrals: Colonoscopy: Up to date Recommended yearly ophthalmology/optometry visit for glaucoma screening and checkup Recommended yearly dental visit for hygiene and checkup  Vaccinations: Influenza vaccine: completed Pneumococcal vaccine: completed series Tdap vaccine: up to date Shingles vaccine: declined  Advanced directives: Please bring a copy of your POA (Power of Attorney) and/or Living Will to your next appointment.   Conditions/risks identified: Obesity, Recommend eating 2 servings of fruits and vegetables daily.   Next appointment: 08/13/17 @ 2:00 PM  Preventive Care 65 Years and Older, Male Preventive care refers to lifestyle choices and visits with your health care provider that can promote health and wellness. What does preventive care include?  A yearly physical exam. This is also called an annual well check.  Dental exams once or twice a year.  Routine eye exams. Ask your health care provider how often you should have your eyes checked.  Personal lifestyle choices, including:  Daily care of your teeth and gums.  Regular physical activity.  Eating a healthy diet.  Avoiding tobacco and drug use.  Limiting alcohol use.  Practicing safe sex.  Taking low doses of aspirin every day.  Taking vitamin and mineral supplements as recommended by your health care provider. What happens during an annual well check? The services and screenings done by your health care provider during your annual well check will depend on your age, overall health, lifestyle risk factors, and family history of disease. Counseling  Your health care provider may ask you questions about your:  Alcohol use.  Tobacco  use.  Drug use.  Emotional well-being.  Home and relationship well-being.  Sexual activity.  Eating habits.  History of falls.  Memory and ability to understand (cognition).  Work and work Astronomerenvironment. Screening  You may have the following tests or measurements:  Height, weight, and BMI.  Blood pressure.  Lipid and cholesterol levels. These may be checked every 5 years, or more frequently if you are over 67 years old.  Skin check.  Lung cancer screening. You may have this screening every year starting at age 67 if you have a 30-pack-year history of smoking and currently smoke or have quit within the past 15 years.  Fecal occult blood test (FOBT) of the stool. You may have this test every year starting at age 67.  Flexible sigmoidoscopy or colonoscopy. You may have a sigmoidoscopy every 5 years or a colonoscopy every 10 years starting at age 67.  Prostate cancer screening. Recommendations will vary depending on your family history and other risks.  Hepatitis C blood test.  Hepatitis B blood test.  Sexually transmitted disease (STD) testing.  Diabetes screening. This is done by checking your blood sugar (glucose) after you have not eaten for a while (fasting). You may have this done every 1-3 years.  Abdominal aortic aneurysm (AAA) screening. You may need this if you are a current or former smoker.  Osteoporosis. You may be screened starting at age 67 if you are at high risk. Talk with your health care provider about your test results, treatment options, and if necessary, the need for more tests. Vaccines  Your health care provider may recommend certain vaccines, such as:  Influenza vaccine. This is recommended every year.  Tetanus, diphtheria,  and acellular pertussis (Tdap, Td) vaccine. You may need a Td booster every 10 years.  Zoster vaccine. You may need this after age 81.  Pneumococcal 13-valent conjugate (PCV13) vaccine. One dose is recommended after age  29.  Pneumococcal polysaccharide (PPSV23) vaccine. One dose is recommended after age 35. Talk to your health care provider about which screenings and vaccines you need and how often you need them. This information is not intended to replace advice given to you by your health care provider. Make sure you discuss any questions you have with your health care provider. Document Released: 10/11/2015 Document Revised: 06/03/2016 Document Reviewed: 07/16/2015 Elsevier Interactive Patient Education  2017 [employer] Prevention in the Home Falls can cause injuries. They can happen to people of all ages. There are many things you can do to make your home safe and to help prevent falls. What can I do on the outside of my home?  Regularly fix the edges of walkways and driveways and fix any cracks.  Remove anything that might make you trip as you walk through a door, such as a raised step or threshold.  Trim any bushes or trees on the path to your home.  Use bright outdoor lighting.  Clear any walking paths of anything that might make someone trip, such as rocks or tools.  Regularly check to see if handrails are loose or broken. Make sure that both sides of any steps have handrails.  Any raised decks and porches should have guardrails on the edges.  Have any leaves, snow, or ice cleared regularly.  Use sand or salt on walking paths during winter.  Clean up any spills in your garage right away. This includes oil or grease spills. What can I do in the bathroom?  Use night lights.  Install grab bars by the toilet and in the tub and shower. Do not use towel bars as grab bars.  Use non-skid mats or decals in the tub or shower.  If you need to sit down in the shower, use a plastic, non-slip stool.  Keep the floor dry. Clean up any water that spills on the floor as soon as it happens.  Remove soap buildup in the tub or shower regularly.  Attach bath mats securely with double-sided  non-slip rug tape.  Do not have throw rugs and other things on the floor that can make you trip. What can I do in the bedroom?  Use night lights.  Make sure that you have a light by your bed that is easy to reach.  Do not use any sheets or blankets that are too big for your bed. They should not hang down onto the floor.  Have a firm chair that has side arms. You can use this for support while you get dressed.  Do not have throw rugs and other things on the floor that can make you trip. What can I do in the kitchen?  Clean up any spills right away.  Avoid walking on wet floors.  Keep items that you use a lot in easy-to-reach places.  If you need to reach something above you, use a strong step stool that has a grab bar.  Keep electrical cords out of the way.  Do not use floor polish or wax that makes floors slippery. If you must use wax, use non-skid floor wax.  Do not have throw rugs and other things on the floor that can make you trip. What can I do with my  stairs?  Do not leave any items on the stairs.  Make sure that there are handrails on both sides of the stairs and use them. Fix handrails that are broken or loose. Make sure that handrails are as long as the stairways.  Check any carpeting to make sure that it is firmly attached to the stairs. Fix any carpet that is loose or worn.  Avoid having throw rugs at the top or bottom of the stairs. If you do have throw rugs, attach them to the floor with carpet tape.  Make sure that you have a light switch at the top of the stairs and the bottom of the stairs. If you do not have them, ask someone to add them for you. What else can I do to help prevent falls?  Wear shoes that:  Do not have high heels.  Have rubber bottoms.  Are comfortable and fit you well.  Are closed at the toe. Do not wear sandals.  If you use a stepladder:  Make sure that it is fully opened. Do not climb a closed stepladder.  Make sure that both  sides of the stepladder are locked into place.  Ask someone to hold it for you, if possible.  Clearly mark and make sure that you can see:  Any grab bars or handrails.  First and last steps.  Where the edge of each step is.  Use tools that help you move around (mobility aids) if they are needed. These include:  Canes.  Walkers.  Scooters.  Crutches.  Turn on the lights when you go into a dark area. Replace any light bulbs as soon as they burn out.  Set up your furniture so you have a clear path. Avoid moving your furniture around.  If any of your floors are uneven, fix them.  If there are any pets around you, be aware of where they are.  Review your medicines with your doctor. Some medicines can make you feel dizzy. This can increase your chance of falling. Ask your doctor what other things that you can do to help prevent falls. This information is not intended to replace advice given to you by your health care provider. Make sure you discuss any questions you have with your health care provider. Document Released: 07/11/2009 Document Revised: 02/20/2016 Document Reviewed: 10/19/2014 Elsevier Interactive Patient Education  2017 [employer].

## 2017-08-12 NOTE — Progress Notes (Signed)
Subjective:   Walter [name] is a 66 y.o. male who presents for Medicare Annual/Subsequent preventive examination.  Review of Systems:  N/A  Cardiac Risk Factors include: advanced age (>4455men, 66>65 women);dyslipidemia;male gender;obesity (BMI >30kg/m2)     Objective:    Vitals: BP 140/86 (BP Location: Left Arm)   Pulse 72   Temp 98.1 F (36.7 C) (Oral)   Ht 6' (1.829 m)   Wt 247 lb 3.2 oz (112.1 kg)   BMI 33.53 kg/m   Body mass index is 33.53 kg/m.  Tobacco Social History   Tobacco Use  Smoking Status Never Smoker  Smokeless Tobacco Never Used     Counseling given: Not Answered   History reviewed. No pertinent past medical history. Past Surgical History:  Procedure Laterality Date  . TONSILLECTOMY AND ADENOIDECTOMY  1960   Family History  Problem Relation Age of Onset  . Hyperthyroidism Mother   . Lung cancer Father   . Heart attack Father   . Hypothyroidism Sister   . Coronary artery disease Brother   . Nephrolithiasis Brother   . Heart attack Maternal Grandfather   . Heart disease Paternal Grandmother   . Emphysema Paternal Grandfather    Social History   Substance and Sexual Activity  Sexual Activity Not on file    Outpatient Encounter Medications as of 08/12/2017  Medication Sig  . ASHWAGANDHA PO Take 800 mg daily by mouth.   . Cinnamon 500 MG capsule Take 500 mg daily by mouth.   . Coenzyme Q10 (COQ-10) 10 MG CAPS Take 100 mg daily by mouth.   . Cyanocobalamin (VITAMIN B-12) 2500 MCG SUBL Place 3 each daily under the tongue.   . cyanocobalamin 100 MCG tablet Take by mouth.  . Glucosamine-Chondroitin (OSTEO BI-FLEX REGULAR STRENGTH PO) Take by mouth.  [name]. MAGNESIUM PO Take 400 mg daily by mouth.   . Methylsulfonylmethane (MSM) 1000 MG CAPS Take daily by mouth.  . milk thistle 175 MG tablet Take 175 mg by mouth daily.  . naproxen sodium (ALEVE) 220 MG tablet Take 220 mg daily as needed by mouth.   . OMEGA-3 FATTY ACIDS PO Take 1,200 mg daily by  mouth.   . Red Yeast Rice Extract (RED YEAST RICE PO) Take 600 mg daily by mouth.   . sildenafil (REVATIO) 20 MG tablet Take 2-3 tablets by mouth 1-4 hours prior to intercourse.  . Turmeric 500 MG CAPS Take daily by mouth.  [name]. UNABLE TO FIND CBD 250 mg daily  . UNABLE TO FIND Red beet powder  . vitamin C (ASCORBIC ACID) 500 MG tablet Take 500 mg daily by mouth.   . Vitamin D, Cholecalciferol, 400 units TABS Take 1,200 mg daily by mouth.   . vitamin E 1000 UNIT capsule Take by mouth.  . [DISCONTINUED] UNABLE TO FIND daily. Med Name:osteo bio    No facility-administered encounter medications on file as of 08/12/2017.     Activities of Daily Living In your present state of health, do you have any difficulty performing the following activities: 08/12/2017 08/13/2016  Hearing? N N  Vision? N N  Difficulty concentrating or making decisions? N N  Walking or climbing stairs? N N  Dressing or bathing? N N  Doing errands, shopping? N N  Preparing Food and eating ? N N  Using the Toilet? N N  In the past six months, have you accidently leaked urine? N N  Do you have problems with loss of bowel control? N N  Managing  your Medications? N N  Managing your Finances? N N  Housekeeping or managing your Housekeeping? N N  Some recent data might be hidden    Patient Care Team: Chrismon, Jodell Ciproennis E, PA as PCP - General (Family Medicine)   Assessment:     Exercise Activities and Dietary recommendations Current Exercise Habits: Home exercise routine, Type of exercise: walking(walks dog daily but not w/o stopping), Intensity: Mild, Exercise limited by: None identified  Goals    None     Fall Risk Fall Risk  08/12/2017 08/13/2016  Falls in the past year? No No   Depression Screen PHQ 2/9 Scores 08/12/2017 08/13/2016  PHQ - 2 Score 0 0    Cognitive Function: Pt declined screening today.     6CIT Screen 08/13/2016  What Year? 0 points  What month? 0 points  What time? 0 points  Count  back from 20 0 points  Months in reverse 0 points  Repeat phrase 4 points  Total Score 4    Immunization History  Administered Date(s) Administered  . Influenza Split 05/22/2013  . Influenza, High Dose Seasonal PF 08/15/2015, 06/25/2016, 07/08/2017  . Influenza,inj,Quad PF,6+ Mos 06/29/2014  . Pneumococcal Conjugate-13 08/15/2015  . Pneumococcal Polysaccharide-23 07/08/2017  . Tetanus 05/01/2016   Screening Tests Health Maintenance  Topic Date Due  . COLONOSCOPY  12/19/2023  . TETANUS/TDAP  05/01/2026  . INFLUENZA VACCINE  Completed  . Hepatitis C Screening  Completed  . PNA vac Low Risk Adult  Completed      Plan:  I have personally reviewed and addressed the Medicare Annual Wellness questionnaire and have noted the following in the patient's chart:  A. Medical and social history B. Use of alcohol, tobacco or illicit drugs  C. Current medications and supplements D. Functional ability and status E.  Nutritional status F.  Physical activity G. Advance directives H. List of other physicians I.  Hospitalizations, surgeries, and ER visits in previous 12 months J.  Vitals K. Screenings such as hearing and vision if needed, cognitive and depression L. Referrals and appointments - none  In addition, I hawwwve reviewed and discussed with patient certain preventive protocols, quality metrics, and best practice recommendations. A written personalized care plan for preventive services as well as general preventive health recommendations were provided to patient.  See attached scanned questionnaire for additional information.   Signed,  Hyacinth MeekerMckenzie Allin Frix, LPN Nurse Health Advisor   MD Recommendations: None.  Reviewed the Nurse Health Advisor's note and was available for consultation. Agree with documentation and plan. Jodell Ciproennis E. Chrismon, PA-C

## 2017-08-13 ENCOUNTER — Ambulatory Visit (INDEPENDENT_AMBULATORY_CARE_PROVIDER_SITE_OTHER): Payer: Medicare Other | Admitting: Family Medicine

## 2017-08-13 ENCOUNTER — Encounter: Payer: Self-pay | Admitting: Family Medicine

## 2017-08-13 VITALS — BP 128/70 | HR 76 | Temp 98.0°F | Wt 248.2 lb

## 2017-08-13 DIAGNOSIS — Z125 Encounter for screening for malignant neoplasm of prostate: Secondary | ICD-10-CM

## 2017-08-13 DIAGNOSIS — E669 Obesity, unspecified: Secondary | ICD-10-CM

## 2017-08-13 DIAGNOSIS — Z0000 Encounter for general adult medical examination without abnormal findings: Secondary | ICD-10-CM | POA: Diagnosis not present

## 2017-08-13 DIAGNOSIS — E782 Mixed hyperlipidemia: Secondary | ICD-10-CM

## 2017-08-13 NOTE — Progress Notes (Signed)
Patient: [name], Male    DOB: 07/20/1951, 66 y.o.   MRN: [id number] Visit Date: 08/13/2017  Today's Provider: Dortha Kernennis Monigue Spraggins, PA   Chief Complaint  Patient presents with  . Annual Exam   Subjective:    Annual physical exam [name] is a 66 y.o. male who presents today for health maintenance and complete physical. He feels well. He reports mildly exercising  walking the dog daily. He reports he is sleeping well.  ----------------------------------------------------------------- AWE was on 08/12/2017  Review of Systems  Constitutional: Negative.   HENT: Negative.   Eyes: Negative.   Respiratory: Negative.   Cardiovascular: Negative.   Gastrointestinal: Negative.   Endocrine: Negative.   Genitourinary: Negative.   Musculoskeletal: Negative.   Skin: Negative.   Allergic/Immunologic: Negative.   Neurological: Negative.   Hematological: Negative.   Psychiatric/Behavioral: Negative.     Social History      He  reports that  has never smoked. he has never used smokeless tobacco. He reports that he does not drink alcohol or use drugs.       Social History   Socioeconomic History  . Marital status: Married    Spouse name: None  . Number of children: None  . Years of education: None  . Highest education level: None  Social Needs  . Financial resource strain: None  . Food insecurity - worry: None  . Food insecurity - inability: None  . Transportation needs - medical: None  . Transportation needs - non-medical: None  Occupational History  . None  Tobacco Use  . Smoking status: Never Smoker  . Smokeless tobacco: Never Used  Substance and Sexual Activity  . Alcohol use: No    Alcohol/week: 0.0 oz    Frequency: Never  . Drug use: No  . Sexual activity: None  Other Topics Concern  . None  Social History Narrative  . None    History reviewed. No pertinent past medical history.   Patient Active Problem List   Diagnosis Date Noted  . Disease  of prostate 01/13/2016  . Snores 01/13/2016  . Hyperlipidemia 01/13/2016  . Herpes zoster without complication 04/25/2010  . 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 Colon, diverticulosis 12/13/2006    Past Surgical History:  Procedure Laterality Date  . TONSILLECTOMY AND ADENOIDECTOMY  1960    Family History        Family Status  Relation Name Status  . Mother  Alive  . Father  Deceased at age 67  . Sister  Alive  . Brother  Alive  . MGF  Deceased at age 67's  . PGM  Deceased  . PGF  Deceased        His family history includes Coronary artery disease in his brother; Emphysema in his paternal grandfather; Heart attack in his father and maternal grandfather; Heart disease in his paternal grandmother; Hyperthyroidism in his mother; Hypothyroidism in his sister; Lung cancer in his father; Nephrolithiasis in his brother.     Allergies  Allergen Reactions  . Antihistamines, Chlorpheniramine-Type     Current Outpatient Medications:  .  ASHWAGANDHA PO, Take 800 mg daily by mouth. , Disp: , Rfl:  .  Cinnamon 500 MG capsule, Take 500 mg daily by mouth. , Disp: , Rfl:  .  Coenzyme Q10 (COQ-10) 10 MG CAPS, Take 100 mg daily by mouth. , Disp: , Rfl:  .  Cyanocobalamin (VITAMIN B-12)  2500 MCG SUBL, Place 3 each daily under the tongue. , Disp: , Rfl:  .  cyanocobalamin 100 MCG tablet, Take by mouth., Disp: , Rfl:  .  Glucosamine-Chondroitin (OSTEO BI-FLEX REGULAR STRENGTH PO), Take by mouth., Disp: , Rfl:  .  MAGNESIUM PO, Take 400 mg daily by mouth. , Disp: , Rfl:  .  Methylsulfonylmethane (MSM) 1000 MG CAPS, Take daily by mouth., Disp: , Rfl:  .  milk thistle 175 MG tablet, Take 175 mg by mouth daily., Disp: , Rfl:  .  naproxen sodium (ALEVE) 220 MG tablet, Take 220 mg daily as needed by mouth. , Disp: , Rfl:  .  OMEGA-3 FATTY ACIDS PO, Take 1,200 mg daily by mouth. , Disp: , Rfl:  .  Red Yeast Rice  Extract (RED YEAST RICE PO), Take 600 mg daily by mouth. , Disp: , Rfl:  .  sildenafil (REVATIO) 20 MG tablet, Take 2-3 tablets by mouth 1-4 hours prior to intercourse., Disp: 30 tablet, Rfl: 3 .  Turmeric 500 MG CAPS, Take daily by mouth., Disp: , Rfl:  .  UNABLE TO FIND, CBD 250 mg daily, Disp: , Rfl:  .  UNABLE TO FIND, Red beet powder, Disp: , Rfl:  .  vitamin C (ASCORBIC ACID) 500 MG tablet, Take 500 mg daily by mouth. , Disp: , Rfl:  .  Vitamin D, Cholecalciferol, 400 units TABS, Take 1,200 mg daily by mouth. , Disp: , Rfl:  .  vitamin E 1000 UNIT capsule, Take by mouth., Disp: , Rfl:    Patient Care Team: Iyanah Demont, Jodell Cipro, PA as PCP - General (Family Medicine)      Objective:   Vitals: BP 128/70 (BP Location: Right Arm, Patient Position: Sitting, Cuff Size: Normal)   Pulse 76   Temp 98 F (36.7 C) (Oral)   Wt 248 lb 3.2 oz (112.6 kg)   SpO2 98%   BMI 33.66 kg/m  Wt Readings from Last 3 Encounters:  08/13/17 248 lb 3.2 oz (112.6 kg)  08/12/17 247 lb 3.2 oz (112.1 kg)  08/13/16 251 lb 8 oz (114.1 kg)    Physical Exam  Constitutional: He is oriented to person, place, and time. He appears well-developed and well-nourished. No distress.  HENT:  Head: Normocephalic and atraumatic.  Right Ear: Hearing and external ear normal.  Left Ear: Hearing normal.  Nose: Nose normal.  Mouth/Throat: Oropharynx is clear and moist.  Eyes: Conjunctivae and lids are normal. Right eye exhibits no discharge. Left eye exhibits no discharge. No scleral icterus.  Neck: Neck supple. No thyromegaly present.  Cardiovascular: Normal rate, regular rhythm and normal heart sounds.  Pulmonary/Chest: Effort normal and breath sounds normal. No respiratory distress.  Abdominal: Soft. Bowel sounds are normal.  Genitourinary: Rectum normal, prostate normal and penis normal. Rectal exam shows guaiac negative stool.  Musculoskeletal: Normal range of motion.  Lymphadenopathy:    He has no cervical  adenopathy.  Neurological: He is alert and oriented to person, place, and time.  Skin: Skin is intact. No lesion and no rash noted.  Psychiatric: He has a normal mood and affect. His speech is normal and behavior is normal. Thought content normal.   Depression Screen PHQ 2/9 Scores 08/12/2017 08/13/2016  PHQ - 2 Score 0 0    Assessment & Plan:     Routine Health Maintenance and Physical Exam  Exercise Activities and Dietary recommendations Goals    Continue to walk dog 30 minutes 3-4 days a week and follow low fat  diet.      Immunization History  Administered Date(s) Administered  . Influenza Split 05/22/2013  . Influenza, High Dose Seasonal PF 08/15/2015, 06/25/2016, 07/08/2017  . Influenza,inj,Quad PF,6+ Mos 06/29/2014  . Pneumococcal Conjugate-13 08/15/2015  . Pneumococcal Polysaccharide-23 07/08/2017  . Tetanus 05/01/2016    Health Maintenance  Topic Date Due  . COLONOSCOPY  12/19/2023  . TETANUS/TDAP  05/01/2026  . INFLUENZA VACCINE  Completed  . Hepatitis C Screening  Completed  . PNA vac Low Risk Adult  Completed     Discussed health benefits of physical activity, and encouraged him to engage in regular exercise appropriate for his age and condition.    --------------------------------------------------------------------  1. Mixed hyperlipidemia Continues to use Omega-3 with Red Yeast Rice and low fat diet. Needs to lose a few pounds and exercise regularly. Recheck labs and follow up pending reports. - COMPLETE METABOLIC PANEL WITH GFR - CBC with Differential/Platelet - Lipid panel - TSH  2. Obesity, Class I, BMI 30-34.9 No significant weight gain this year. Check labs, encouraged to exercise and follow low fat diet. - COMPLETE METABOLIC PANEL WITH GFR - CBC with Differential/Platelet - Lipid panel - TSH  3. Screening PSA (prostate specific antigen) No nocturia, hesitancy or dysuria.  Recheck PSA. - PSA  4. Annual physical exam General health  good. Immunizations up to date. Will check with insurance about coverage for the shingles vaccination. Had colonoscopy in 2015. Given anticipatory counseling. Reviewed wellness screening done yesterday.   Dortha Kernennis Kosha Jaquith, PA  [facility]

## 2017-08-16 DIAGNOSIS — E782 Mixed hyperlipidemia: Secondary | ICD-10-CM | POA: Diagnosis not present

## 2017-08-16 DIAGNOSIS — Z125 Encounter for screening for malignant neoplasm of prostate: Secondary | ICD-10-CM | POA: Diagnosis not present

## 2017-08-16 LAB — COMPLETE METABOLIC PANEL WITH GFR
AG Ratio: 1.6 (calc) (ref 1.0–2.5)
ALKALINE PHOSPHATASE (APISO): 75 U/L (ref 40–115)
ALT: 16 U/L (ref 9–46)
AST: 17 U/L (ref 10–35)
Albumin: 4.1 g/dL (ref 3.6–5.1)
BILIRUBIN TOTAL: 1 mg/dL (ref 0.2–1.2)
BUN: 11 mg/dL (ref 7–25)
CHLORIDE: 99 mmol/L (ref 98–110)
CO2: 29 mmol/L (ref 20–32)
Calcium: 9.2 mg/dL (ref 8.6–10.3)
Creat: 0.94 mg/dL (ref 0.70–1.25)
GFR, EST AFRICAN AMERICAN: 97 mL/min/{1.73_m2} (ref >=60)
GFR, Est Non African American: 84 mL/min/{1.73_m2} (ref >=60)
GLUCOSE: 85 mg/dL (ref 65–99)
Globulin: 2.6 g/dL (calc) (ref 1.9–3.7)
Potassium: 4.3 mmol/L (ref 3.5–5.3)
Sodium: 135 mmol/L (ref 135–146)
TOTAL PROTEIN: 6.7 g/dL (ref 6.1–8.1)

## 2017-08-16 LAB — LIPID PANEL
CHOL/HDL RATIO: 2.8 (calc) (ref <5.0)
Cholesterol: 155 mg/dL (ref <200)
HDL: 56 mg/dL (ref >40)
LDL Cholesterol (Calc): 87 mg/dL (calc)
NON-HDL CHOLESTEROL (CALC): 99 mg/dL (ref <130)
Triglycerides: 50 mg/dL (ref <150)

## 2017-08-16 LAB — CBC WITH DIFFERENTIAL/PLATELET
Basophils Absolute: 18 cells/uL (ref 0–200)
Basophils Relative: 0.2 %
EOS PCT: 0.3 %
Eosinophils Absolute: 27 cells/uL (ref 15–500)
HEMATOCRIT: 40.7 % (ref 38.5–50.0)
HEMOGLOBIN: 14.1 g/dL (ref 13.2–17.1)
LYMPHS ABS: 828 {cells}/uL — AB (ref 850–3900)
MCH: 31.8 pg (ref 27.0–33.0)
MCHC: 34.6 g/dL (ref 32.0–36.0)
MCV: 91.7 fL (ref 80.0–100.0)
MPV: 10.2 fL (ref 7.5–12.5)
Monocytes Relative: 6.1 %
NEUTROS ABS: 7671 {cells}/uL (ref 1500–7800)
Neutrophils Relative %: 84.3 %
Platelets: 175 10*3/uL (ref 140–400)
RBC: 4.44 10*6/uL (ref 4.20–5.80)
RDW: 12 % (ref 11.0–15.0)
Total Lymphocyte: 9.1 %
WBC mixed population: 555 cells/uL (ref 200–950)
WBC: 9.1 10*3/uL (ref 3.8–10.8)

## 2017-08-16 LAB — TSH: TSH: 0.63 m[IU]/L (ref 0.40–4.50)

## 2017-08-16 LAB — PSA: PSA: 0.3 ng/mL (ref <=4.0)

## 2018-02-07 ENCOUNTER — Ambulatory Visit (INDEPENDENT_AMBULATORY_CARE_PROVIDER_SITE_OTHER): Payer: Medicare Other | Admitting: Family Medicine

## 2018-02-07 ENCOUNTER — Encounter: Payer: Self-pay | Admitting: Family Medicine

## 2018-02-07 VITALS — BP 122/78 | HR 68 | Temp 98.3°F | Wt 250.2 lb

## 2018-02-07 DIAGNOSIS — J029 Acute pharyngitis, unspecified: Secondary | ICD-10-CM | POA: Diagnosis not present

## 2018-02-07 NOTE — Progress Notes (Signed)
Patient: Walter [name] Male    DOB: [birth year]   67 y.o.   MRN: [id number] Visit Date: 02/07/2018  Today's Provider: Dortha Kern, PA   Chief Complaint  Patient presents with  . Sore Throat   Subjective:    Sore Throat   This is a new problem. Episode onset: 3 weeks ago. The problem has been gradually improving. Neither side of throat is experiencing more pain than the other. There has been no fever. The patient is experiencing no pain. Associated symptoms comments: Patient states his throat feels "hot" He states he had postnasal drainage . Treatments tried: Allergy medication and Nasal spray  The treatment provided mild relief.   History reviewed. No pertinent past medical history. Patient Active Problem List   Diagnosis Date Noted  . Disease of prostate 01/13/2016  . Snores 01/13/2016  . Hyperlipidemia 01/13/2016  . Herpes zoster without complication 04/25/2010  . 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 Colon, diverticulosis 12/13/2006   Past Surgical History:  Procedure Laterality Date  . TONSILLECTOMY AND ADENOIDECTOMY  1960   Family History  Problem Relation Age of Onset  . Hyperthyroidism Mother   . Lung cancer Father   . Heart attack Father   . Hypothyroidism Sister   . Coronary artery disease Brother   . Nephrolithiasis Brother   . Heart attack Maternal Grandfather   . Heart disease Paternal Grandmother   . Emphysema Paternal Grandfather    Allergies  Allergen Reactions  . Antihistamines, Chlorpheniramine-Type     Current Outpatient Medications:  .  ASHWAGANDHA PO, Take 800 mg daily by mouth. , Disp: , Rfl:  .  Cinnamon 500 MG capsule, Take 500 mg daily by mouth. , Disp: , Rfl:  .  Coenzyme Q10 (COQ-10) 10 MG CAPS, Take 100 mg daily by mouth. , Disp: , Rfl:  .  Cyanocobalamin (VITAMIN B-12) 2500 MCG SUBL, Place 3 each daily under the tongue. , Disp: , Rfl:  .   cyanocobalamin 100 MCG tablet, Take by mouth., Disp: , Rfl:  .  Glucosamine-Chondroitin (OSTEO BI-FLEX REGULAR STRENGTH PO), Take by mouth., Disp: , Rfl:  .  MAGNESIUM PO, Take 400 mg daily by mouth. , Disp: , Rfl:  .  Methylsulfonylmethane (MSM) 1000 MG CAPS, Take daily by mouth., Disp: , Rfl:  .  milk thistle 175 MG tablet, Take 175 mg by mouth daily., Disp: , Rfl:  .  naproxen sodium (ALEVE) 220 MG tablet, Take 220 mg daily as needed by mouth. , Disp: , Rfl:  .  OMEGA-3 FATTY ACIDS PO, Take 1,200 mg daily by mouth. , Disp: , Rfl:  .  Red Yeast Rice Extract (RED YEAST RICE PO), Take 600 mg daily by mouth. , Disp: , Rfl:  .  sildenafil (REVATIO) 20 MG tablet, Take 2-3 tablets by mouth 1-4 hours prior to intercourse., Disp: 30 tablet, Rfl: 3 .  Turmeric 500 MG CAPS, Take daily by mouth., Disp: , Rfl:  .  UNABLE TO FIND, CBD 250 mg daily, Disp: , Rfl:  .  UNABLE TO FIND, Red beet powder, Disp: , Rfl:  .  vitamin C (ASCORBIC ACID) 500 MG tablet, Take 500 mg daily by mouth. , Disp: , Rfl:  .  Vitamin D, Cholecalciferol, 400 units TABS, Take 1,200 mg daily by mouth. , Disp: , Rfl:  .  vitamin E 1000 UNIT capsule, Take by mouth., Disp: ,  Rfl:   Review of Systems  Constitutional: Negative.   HENT: Positive for sore throat.   Eyes: Negative.   Respiratory: Negative.   Cardiovascular: Negative.    Social History   Tobacco Use  . Smoking status: Never Smoker  . Smokeless tobacco: Never Used  Substance Use Topics  . Alcohol use: No    Alcohol/week: 0.0 oz    Frequency: Never   Objective:   BP 122/78 (BP Location: Right Arm, Patient Position: Sitting, Cuff Size: Normal)   Pulse 68   Temp 98.3 F (36.8 C) (Oral)   Wt 250 lb 3.2 oz (113.5 kg)   SpO2 97%   BMI 33.93 kg/m   Physical Exam  Constitutional: He is oriented to person, place, and time. He appears well-developed and well-nourished. No distress.  HENT:  Head: Normocephalic and atraumatic.  Right Ear: Hearing and tympanic  membrane normal.  Left Ear: Hearing and tympanic membrane normal.  Nose: Nose normal.  Mouth/Throat: Uvula is midline and mucous membranes are normal. No oropharyngeal exudate or posterior oropharyngeal edema.  Eyes: Conjunctivae, EOM and lids are normal. Right eye exhibits no discharge. Left eye exhibits no discharge. No scleral icterus.  Neck: Normal range of motion. Neck supple.  Cardiovascular: Normal rate and regular rhythm.  Pulmonary/Chest: Effort normal and breath sounds normal. No respiratory distress.  Abdominal: Soft. Bowel sounds are normal.  Musculoskeletal: Normal range of motion.  Lymphadenopathy:    He has no cervical adenopathy.  Neurological: He is alert and oriented to person, place, and time.  Skin: Skin is intact. No lesion noted.  Psychiatric: He has a normal mood and affect. His speech is normal and behavior is normal. Thought content normal.      Assessment & Plan:      1. Sore throat Onset over the past 3 weeks with history of rhinorrhea and PND. Describes discomfort as a "burning". No cough or fever. Increase fluid intake and may use warm saltwater gargles or a teaspoon of liquid Benadryl with one teaspoon of a liquid antacid prn.. Continue throat lozenge, prn. Recheck if no better in 5-7 days.       Dortha Kern, PA  [facility]

## 2018-03-21 ENCOUNTER — Encounter: Payer: Self-pay | Admitting: Family Medicine

## 2018-03-21 ENCOUNTER — Ambulatory Visit: Payer: Medicare Other | Admitting: Family Medicine

## 2018-03-21 VITALS — BP 122/76 | HR 69 | Temp 97.5°F | Wt 245.8 lb

## 2018-03-21 DIAGNOSIS — K429 Umbilical hernia without obstruction or gangrene: Secondary | ICD-10-CM

## 2018-03-21 DIAGNOSIS — R140 Abdominal distension (gaseous): Secondary | ICD-10-CM

## 2018-03-21 NOTE — Progress Notes (Signed)
Patient: Walter [name] Male    DOB: 1950/10/12   67 y.o.   MRN: [id number] Visit Date: 03/21/2018  Today's Provider: Dortha Kernennis Madia Carvell, PA   Chief Complaint  Patient presents with  . Abdominal Pain   Subjective:    Abdominal Pain  This is a new problem. The current episode started 1 to 4 weeks ago. The onset quality is sudden. The problem has been waxing and waning. Pain location: right side abdominal discoloration. The patient is experiencing no pain. Associated symptoms include constipation (started on Thursday). Associated symptoms comments: Loss of appetite . The pain is aggravated by eating. The pain is relieved by bowel movements. Treatments tried: Miralax. The treatment provided mild relief.  History reviewed. No pertinent past medical history. Patient Active Problem List   Diagnosis Date Noted  . Disease of prostate 01/13/2016  . Snores 01/13/2016  . Hyperlipidemia 01/13/2016  . Herpes zoster without complication 04/25/2010  . 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 Colon, diverticulosis 12/13/2006   Past Surgical History:  Procedure Laterality Date  . TONSILLECTOMY AND ADENOIDECTOMY  1960   Family History  Problem Relation Age of Onset  . Hyperthyroidism Mother   . Lung cancer Father   . Heart attack Father   . Hypothyroidism Sister   . Coronary artery disease Brother   . Nephrolithiasis Brother   . Heart attack Maternal Grandfather   . Heart disease Paternal Grandmother   . Emphysema Paternal Grandfather    Allergies  Allergen Reactions  . Antihistamines, Chlorpheniramine-Type     Current Outpatient Medications:  .  ASHWAGANDHA PO, Take 800 mg daily by mouth. , Disp: , Rfl:  .  Cinnamon 500 MG capsule, Take 500 mg daily by mouth. , Disp: , Rfl:  .  Coenzyme Q10 (COQ-10) 10 MG CAPS, Take 100 mg daily by mouth. , Disp: , Rfl:  .  Cyanocobalamin (VITAMIN B-12) 2500 MCG  SUBL, Place 3 each daily under the tongue. , Disp: , Rfl:  .  cyanocobalamin 100 MCG tablet, Take by mouth., Disp: , Rfl:  .  Glucosamine-Chondroitin (OSTEO BI-FLEX REGULAR STRENGTH PO), Take by mouth., Disp: , Rfl:  .  MAGNESIUM PO, Take 400 mg daily by mouth. , Disp: , Rfl:  .  Methylsulfonylmethane (MSM) 1000 MG CAPS, Take daily by mouth., Disp: , Rfl:  .  milk thistle 175 MG tablet, Take 175 mg by mouth daily., Disp: , Rfl:  .  naproxen sodium (ALEVE) 220 MG tablet, Take 220 mg daily as needed by mouth. , Disp: , Rfl:  .  OMEGA-3 FATTY ACIDS PO, Take 1,200 mg daily by mouth. , Disp: , Rfl:  .  Red Yeast Rice Extract (RED YEAST RICE PO), Take 600 mg daily by mouth. , Disp: , Rfl:  .  sildenafil (REVATIO) 20 MG tablet, Take 2-3 tablets by mouth 1-4 hours prior to intercourse., Disp: 30 tablet, Rfl: 3 .  Turmeric 500 MG CAPS, Take daily by mouth., Disp: , Rfl:  .  UNABLE TO FIND, CBD 250 mg daily, Disp: , Rfl:  .  UNABLE TO FIND, Red beet powder, Disp: , Rfl:  .  vitamin C (ASCORBIC ACID) 500 MG tablet, Take 500 mg daily by mouth. , Disp: , Rfl:  .  Vitamin D, Cholecalciferol, 400 units TABS, Take 1,200 mg daily by mouth. , Disp: , Rfl:  .  vitamin E 1000 UNIT capsule, Take by mouth.,  Disp: , Rfl:   Review of Systems  Constitutional: Positive for appetite change.  Respiratory: Negative.   Cardiovascular: Negative.   Gastrointestinal: Positive for abdominal pain and constipation (started on Thursday).   Social History   Tobacco Use  . Smoking status: Never Smoker  . Smokeless tobacco: Never Used  Substance Use Topics  . Alcohol use: No    Alcohol/week: 0.0 oz    Frequency: Never   Objective:   BP 122/76 (BP Location: Right Arm, Patient Position: Sitting, Cuff Size: Normal)   Pulse 69   Temp (!) 97.5 F (36.4 C) (Oral)   Wt 245 lb 12.8 oz (111.5 kg)   SpO2 98%   BMI 33.34 kg/m   Physical Exam  Constitutional: He is oriented to person, place, and time. He appears  well-developed and well-nourished. No distress.  HENT:  Head: Normocephalic and atraumatic.  Right Ear: Hearing and external ear normal.  Left Ear: Hearing and external ear normal.  Nose: Nose normal.  Mouth/Throat: Oropharynx is clear and moist.  Eyes: Conjunctivae and lids are normal. Right eye exhibits no discharge. Left eye exhibits no discharge. No scleral icterus.  Cardiovascular: Normal rate.  Pulmonary/Chest: Effort normal and breath sounds normal. No respiratory distress.  Abdominal: Soft. Bowel sounds are normal. A hernia is present.  Small umbilical hernia. Minimal soreness.  Musculoskeletal: Normal range of motion.  Neurological: He is alert and oriented to person, place, and time.  Skin: Skin is intact. No lesion and no rash noted.  Psychiatric: He has a normal mood and affect. His speech is normal and behavior is normal. Thought content normal.      Assessment & Plan:     1. Umbilical hernia without obstruction and without gangrene Noticed some soreness and slight swelling. Will use an abdominal binder if doing any lifting and let us know if (or when) he may need surgical referral.  2. Flatulence/gas pain/belching Had used laxatives to cleanse bowel (Dulcolax and Miralax) and noticed borborygmi that was very loud. Recommend Simethicone (Gas-X or Beano) and recheck prn. No nausea, vomiting or diarrhea.       Dortha Kern, PA  [facility]

## 2018-03-21 NOTE — Patient Instructions (Signed)
Simethicone chewable tablets What is this medicine? SIMETHICONE (sye METH i kone) is used to decrease the discomfort caused by gas. This medicine may be used for other purposes; ask your health care provider or pharmacist if you have questions. COMMON BRAND NAME(S): Gas Relief, Gas-X, Gas-X Extra Strength, Gas-X with Maalox, Genasyme, Maalox Max, Maalox Max Plus [place], Mylanta Gas, Mytab Gas, Phazyme, Titralac Plus What should I tell my health care provider before I take this medicine? They need to know if you have any of these conditions: -phenylketonuria -an unusual or allergic reaction to simethicone, other medicines, foods, dyes, or preservatives -pregnant or trying to get pregnant -breast-feeding How should I use this medicine? Take this medicine by mouth. Crush or chew the tablets. Do not swallow them whole. Follow the directions on the label or those given to you by your doctor or health care professional. Do not take your medicine more often than directed. Talk to your pediatrician regarding the use of this medicine in children. While this medicine may be used in children as young as 12 years for selected conditions, precautions do apply. Overdosage: If you think you have taken too much of this medicine contact a poison control center or emergency room at once. NOTE: This medicine is only for you. Do not share this medicine with others. What if I miss a dose? This does not apply. You will only use this medicine as needed for gas pain. Do not use double or extra doses. What may interact with this medicine? Interactions are not expected. This list may not describe all possible interactions. Give your health care provider a list of all the medicines, herbs, non-prescription drugs, or dietary supplements you use. Also tell them if you smoke, drink alcohol, or use illegal drugs. Some items may interact with your medicine. What should I watch for while using this medicine? Tell your doctor  or health care professional if your symptoms get worse, or if you have severe pain, diarrhea, constipation, or blood in your stool. These could be signs of a more serious condition. What side effects may I notice from receiving this medicine? There are no reported side effects of this medicine. This list may not describe all possible side effects. Call your doctor for medical advice about side effects. You may report side effects to FDA at 1-800-FDA-1088. Where should I keep my medicine? Keep out of the reach of children. Store at room temperature between 15 and 30 degrees C (59 and 86 degrees F). Keep container tightly closed. Throw away any unused medicine after the expiration date. NOTE: This sheet is a summary. It may not cover all possible information. If you have questions about this medicine, talk to your doctor, pharmacist, or health care provider.  2018 Elsevier/Gold Standard (2008-05-16 15:45:11) Umbilical Hernia, Adult A hernia is a bulge of tissue that pushes through an opening between muscles. An umbilical hernia happens in the abdomen, near the belly button (umbilicus). The hernia may contain tissues from the small intestine, large intestine, or fatty tissue covering the intestines (omentum). Umbilical hernias in adults tend to get worse over time, and they require surgical treatment. There are several types of umbilical hernias. You may have:  A hernia located just above or below the umbilicus (indirect hernia). This is the most common type of umbilical hernia in adults.  A hernia that forms through an opening formed by the umbilicus (direct hernia).  A hernia that comes and goes (reducible hernia). A reducible hernia may be visible  only when you strain, lift something heavy, or cough. This type of hernia can be pushed back into the abdomen (reduced).  A hernia that traps abdominal tissue inside the hernia (incarcerated hernia). This type of hernia cannot be reduced.  A hernia  that cuts off blood flow to the tissues inside the hernia (strangulated hernia). The tissues can start to die if this happens. This type of hernia requires emergency treatment.  What are the causes? An umbilical hernia happens when tissue inside the abdomen presses on a weak area of the abdominal muscles. What increases the risk? You may have a greater risk of this condition if you:  Are obese.  Have had several pregnancies.  Have a buildup of fluid inside your abdomen (ascites).  Have had surgery that weakens the abdominal muscles.  What are the signs or symptoms? The main symptom of this condition is a painless bulge at or near the belly button. A reducible hernia may be visible only when you strain, lift something heavy, or cough. Other symptoms may include:  Dull pain.  A feeling of pressure.  Symptoms of a strangulated hernia may include:  Pain that gets increasingly worse.  Nausea and vomiting.  Pain when pressing on the hernia.  Skin over the hernia becoming red or purple.  Constipation.  Blood in the stool.  How is this diagnosed? This condition may be diagnosed based on:  A physical exam. You may be asked to cough or strain while standing. These actions increase the pressure inside your abdomen and force the hernia through the opening in your muscles. Your health care provider may try to reduce the hernia by pressing on it.  Your symptoms and medical history.  How is this treated? Surgery is the only treatment for an umbilical hernia. Surgery for a strangulated hernia is done as soon as possible. If you have a small hernia that is not incarcerated, you may need to lose weight before having surgery. Follow these instructions at home:  Lose weight, if told by your health care provider.  Do not try to push the hernia back in.  Watch your hernia for any changes in color or size. Tell your health care provider if any changes occur.  You may need to avoid  activities that increase pressure on your hernia.  Do not lift anything that is heavier than 10 lb (4.5 kg) until your health care provider says that this is safe.  Take over-the-counter and prescription medicines only as told by your health care provider.  Keep all follow-up visits as told by your health care provider. This is important. Contact a health care provider if:  Your hernia gets larger.  Your hernia becomes painful. Get help right away if:  You develop sudden, severe pain near the area of your hernia.  You have pain as well as nausea or vomiting.  You have pain and the skin over your hernia changes color.  You develop a fever. This information is not intended to replace advice given to you by your health care provider. Make sure you discuss any questions you have with your health care provider. Document Released: 02/14/2016 Document Revised: 05/17/2016 Document Reviewed: 02/14/2016 Elsevier Interactive Patient Education  [employer]2018 Elsevier Inc.

## 2018-06-25 ENCOUNTER — Ambulatory Visit (INDEPENDENT_AMBULATORY_CARE_PROVIDER_SITE_OTHER): Payer: Medicare Other

## 2018-06-25 DIAGNOSIS — Z23 Encounter for immunization: Secondary | ICD-10-CM

## 2018-06-29 ENCOUNTER — Ambulatory Visit: Payer: Medicare Other

## 2018-07-14 ENCOUNTER — Telehealth: Payer: Self-pay

## 2018-07-14 NOTE — Telephone Encounter (Signed)
LMTCB and schedule AWV. Last AWV was 08/12/18. -MM

## 2018-07-25 NOTE — Telephone Encounter (Signed)
Spoke with wife who states pt is driving and he will CB tomorrow to schedule AWV. -MM

## 2018-07-29 NOTE — Telephone Encounter (Signed)
Already scheduled for 08/19/18. -MM

## 2018-08-19 ENCOUNTER — Ambulatory Visit (INDEPENDENT_AMBULATORY_CARE_PROVIDER_SITE_OTHER): Payer: Medicare Other

## 2018-08-19 VITALS — BP 126/78 | HR 83 | Temp 98.4°F | Ht 72.0 in | Wt 259.0 lb

## 2018-08-19 DIAGNOSIS — Z0000 Encounter for general adult medical examination without abnormal findings: Secondary | ICD-10-CM | POA: Diagnosis not present

## 2018-08-19 NOTE — Patient Instructions (Addendum)
[name] , Thank you for taking time to come for your Medicare Wellness Visit. I appreciate your ongoing commitment to your health goals. Please review the following plan we discussed and let me know if I can assist you in the future.   Screening recommendations/referrals: Colonoscopy: Up to date, due 11/2023 Recommended yearly ophthalmology/optometry visit for glaucoma screening and checkup Recommended yearly dental visit for hygiene and checkup  Vaccinations: Influenza vaccine: Up to date Pneumococcal vaccine: Completed series Tdap vaccine: Up to date, 04/2026 Shingles vaccine: Pt declines today.     Advanced directives: Please bring a copy of your POA (Power of Attorney) and/or Living Will to your next appointment.   Conditions/risks identified: Obesity- recommend to exercise for 3 days a week for at least 30 minutes at a time.   Next appointment: Declined scheduling a CPE at this time. Scheduled next AWV for 08/28/19.   Preventive Care 7065 Years and Older, Male Preventive care refers to lifestyle choices and visits with your health care provider that can promote health and wellness. What does preventive care include?  A yearly physical exam. This is also called an annual well check.  Dental exams once or twice a year.  Routine eye exams. Ask your health care provider how often you should have your eyes checked.  Personal lifestyle choices, including:  Daily care of your teeth and gums.  Regular physical activity.  Eating a healthy diet.  Avoiding tobacco and drug use.  Limiting alcohol use.  Practicing safe sex.  Taking low doses of aspirin every day.  Taking vitamin and mineral supplements as recommended by your health care provider. What happens during an annual well check? The services and screenings done by your health care provider during your annual well check will depend on your age, overall health, lifestyle risk factors, and family history of  disease. Counseling  Your health care provider may ask you questions about your:  Alcohol use.  Tobacco use.  Drug use.  Emotional well-being.  Home and relationship well-being.  Sexual activity.  Eating habits.  History of falls.  Memory and ability to understand (cognition).  Work and work Astronome[employer]vironment. Screening  You may have the following tests or measurements:  Height, weight, and BMI.  Blood pressure.  Lipid and cholesterol levels. These may be checked every 5 years, or more frequently if you are over 68 years old.  Skin check.  Lung cancer screening. You may have this screening every year starting at age 68 if you have a 30-pack-year history of smoking and currently smoke or have quit within the past 15 years.  Fecal occult blood test (FOBT) of the stool. You may have this test every year starting at age 68.  Flexible sigmoidoscopy or colonoscopy. You may have a sigmoidoscopy every 5 years or a colonoscopy every 10 years starting at age 68.  Prostate cancer screening. Recommendations will vary depending on your family history and other risks.  Hepatitis C blood test.  Hepatitis B blood test.  Sexually transmitted disease (STD) testing.  Diabetes screening. This is done by checking your blood sugar (glucose) after you have not eaten for a while (fasting). You may have this done every 1-3 years.  Abdominal aortic aneurysm (AAA) screening. You may need this if you are a current or former smoker.  Osteoporosis. You may be screened starting at age 68 if you are at high risk. Talk with your health care provider about your test results, treatment options, and if necessary, the need  for more tests. Vaccines  Your health care provider may recommend certain vaccines, such as:  Influenza vaccine. This is recommended every year.  Tetanus, diphtheria, and acellular pertussis (Tdap, Td) vaccine. You may need a Td booster every 10 years.  Zoster vaccine. You may  need this after age 56.  Pneumococcal 13-valent conjugate (PCV13) vaccine. One dose is recommended after age 61.  Pneumococcal polysaccharide (PPSV23) vaccine. One dose is recommended after age 67. Talk to your health care provider about which screenings and vaccines you need and how often you need them. This information is not intended to replace advice given to you by your health care provider. Make sure you discuss any questions you have with your health care provider. Document Released: 10/11/2015 Document Revised: 06/03/2016 Document Reviewed: 07/16/2015 Elsevier Interactive Patient Education  2017 [employer] Prevention in the Home Falls can cause injuries. They can happen to people of all ages. There are many things you can do to make your home safe and to help prevent falls. What can I do on the outside of my home?  Regularly fix the edges of walkways and driveways and fix any cracks.  Remove anything that might make you trip as you walk through a door, such as a raised step or threshold.  Trim any bushes or trees on the path to your home.  Use bright outdoor lighting.  Clear any walking paths of anything that might make someone trip, such as rocks or tools.  Regularly check to see if handrails are loose or broken. Make sure that both sides of any steps have handrails.  Any raised decks and porches should have guardrails on the edges.  Have any leaves, snow, or ice cleared regularly.  Use sand or salt on walking paths during winter.  Clean up any spills in your garage right away. This includes oil or grease spills. What can I do in the bathroom?  Use night lights.  Install grab bars by the toilet and in the tub and shower. Do not use towel bars as grab bars.  Use non-skid mats or decals in the tub or shower.  If you need to sit down in the shower, use a plastic, non-slip stool.  Keep the floor dry. Clean up any water that spills on the floor as soon as it  happens.  Remove soap buildup in the tub or shower regularly.  Attach bath mats securely with double-sided non-slip rug tape.  Do not have throw rugs and other things on the floor that can make you trip. What can I do in the bedroom?  Use night lights.  Make sure that you have a light by your bed that is easy to reach.  Do not use any sheets or blankets that are too big for your bed. They should not hang down onto the floor.  Have a firm chair that has side arms. You can use this for support while you get dressed.  Do not have throw rugs and other things on the floor that can make you trip. What can I do in the kitchen?  Clean up any spills right away.  Avoid walking on wet floors.  Keep items that you use a lot in easy-to-reach places.  If you need to reach something above you, use a strong step stool that has a grab bar.  Keep electrical cords out of the way.  Do not use floor polish or wax that makes floors slippery. If you must use wax, use  non-skid floor wax.  Do not have throw rugs and other things on the floor that can make you trip. What can I do with my stairs?  Do not leave any items on the stairs.  Make sure that there are handrails on both sides of the stairs and use them. Fix handrails that are broken or loose. Make sure that handrails are as long as the stairways.  Check any carpeting to make sure that it is firmly attached to the stairs. Fix any carpet that is loose or worn.  Avoid having throw rugs at the top or bottom of the stairs. If you do have throw rugs, attach them to the floor with carpet tape.  Make sure that you have a light switch at the top of the stairs and the bottom of the stairs. If you do not have them, ask someone to add them for you. What else can I do to help prevent falls?  Wear shoes that:  Do not have high heels.  Have rubber bottoms.  Are comfortable and fit you well.  Are closed at the toe. Do not wear sandals.  If you  use a stepladder:  Make sure that it is fully opened. Do not climb a closed stepladder.  Make sure that both sides of the stepladder are locked into place.  Ask someone to hold it for you, if possible.  Clearly mark and make sure that you can see:  Any grab bars or handrails.  First and last steps.  Where the edge of each step is.  Use tools that help you move around (mobility aids) if they are needed. These include:  Canes.  Walkers.  Scooters.  Crutches.  Turn on the lights when you go into a dark area. Replace any light bulbs as soon as they burn out.  Set up your furniture so you have a clear path. Avoid moving your furniture around.  If any of your floors are uneven, fix them.  If there are any pets around you, be aware of where they are.  Review your medicines with your doctor. Some medicines can make you feel dizzy. This can increase your chance of falling. Ask your doctor what other things that you can do to help prevent falls. This information is not intended to replace advice given to you by your health care provider. Make sure you discuss any questions you have with your health care provider. Document Released: 07/11/2009 Document Revised: 02/20/2016 Document Reviewed: 10/19/2014 Elsevier Interactive Patient Education  2017 [employer].

## 2018-08-19 NOTE — Progress Notes (Addendum)
Subjective:   [place] NationKenneth R [name] is a 67 y.o. male who presents for Medicare Annual/Subsequent preventive examination.  Review of Systems:  N/A  Cardiac Risk Factors include: advanced age (>4055men, 33>65 women);dyslipidemia;male gender;obesity (BMI >30kg/m2)     Objective:    Vitals: BP 126/78 (BP Location: Right Arm)   Pulse 83   Temp 98.4 F (36.9 C) (Oral)   Ht 6' (1.829 m)   Wt 259 lb (117.5 kg)   BMI 35.13 kg/m   Body mass index is 35.13 kg/m.  Advanced Directives 08/19/2018 08/12/2017 08/13/2016  Does Patient Have a Medical Advance Directive? Yes Yes No  Type of Estate agentAdvance Directive Healthcare Power of [place];Living will Healthcare Power of [place];Living will -  Copy of Healthcare Power of Attorney in Chart? No - copy requested No - copy requested -  Would patient like information on creating a medical advance directive? - - No - patient declined information    Tobacco Social History   Tobacco Use  Smoking Status Never Smoker  Smokeless Tobacco Never Used     Counseling given: Not Answered   Clinical Intake:  Pre-visit preparation completed: Yes  Pain : No/denies pain Pain Score: 0-No pain     Nutritional Status: BMI > 30  Obese Nutritional Risks: None Diabetes: No  How often do you need to have someone help you when you read instructions, pamphlets, or other written materials from your doctor or pharmacy?: 1 - Never  Interpreter Needed?: No  Information entered by :: [facility] LLCMmarkoski, LPN  History reviewed. No pertinent past medical history. Past Surgical History:  Procedure Laterality Date  . TONSILLECTOMY AND ADENOIDECTOMY  1960   Family History  Problem Relation Age of Onset  . Hyperthyroidism Mother   . Lung cancer Father   . Heart attack Father   . Hypothyroidism Sister   . Coronary artery disease Brother   . Nephrolithiasis Brother   . Heart attack Maternal Grandfather   . Heart disease Paternal Grandmother   . Emphysema Paternal Grandfather     Social History   Socioeconomic History  . Marital status: Married    Spouse name: Not on file  . Number of children: 1  . Years of education: Not on file  . Highest education level: GED or equivalent  Occupational History  . Occupation: retired  Engineer, productionocial Needs  . Financial resource strain: Not hard at all  . Food insecurity:    Worry: Never true    Inability: Never true  . Transportation needs:    Medical: No    Non-medical: No  Tobacco Use  . Smoking status: Never Smoker  . Smokeless tobacco: Never Used  Substance and Sexual Activity  . Alcohol use: No    Alcohol/week: 0.0 standard drinks    Frequency: Never  . Drug use: No  . Sexual activity: Not on file  Lifestyle  . Physical activity:    Days per week: 0 days    Minutes per session: 0 min  . Stress: Not at all  Relationships  . Social connections:    Talks on phone: Patient refused    Gets together: Patient refused    Attends religious service: Patient refused    Active member of club or organization: Patient refused    Attends meetings of clubs or organizations: Patient refused    Relationship status: Patient refused  Other Topics Concern  . Not on file  Social History Narrative  . Not on file    Outpatient Encounter Medications as  of 08/19/2018  Medication Sig  . ASHWAGANDHA PO Take 800 mg daily by mouth.   . Cinnamon 500 MG capsule Take 500 mg by mouth daily.   . Coenzyme Q10 (COQ-10) 10 MG CAPS Take 100 mg daily by mouth.   . Cyanocobalamin (VITAMIN B-12) 2500 MCG SUBL Place 3 each daily under the tongue.   . cyanocobalamin 100 MCG tablet Take 100 mcg by mouth daily.   . Glucosamine-Chondroitin (OSTEO BI-FLEX REGULAR STRENGTH PO) Take by mouth daily.   . naproxen sodium (ALEVE) 220 MG tablet Take 220 mg daily as needed by mouth.   . NON FORMULARY   . OMEGA-3 FATTY ACIDS PO Take 1,200 mg daily by mouth.   . Red Yeast Rice Extract (RED YEAST RICE PO) Take 600 mg daily by mouth.   . sildenafil  (REVATIO) 20 MG tablet Take 2-3 tablets by mouth 1-4 hours prior to intercourse.  . Turmeric 500 MG CAPS Take by mouth daily.   [name] UNABLE TO FIND CBD 250 mg daily  . UNABLE TO FIND daily. Red beet powder   . vitamin E 1000 UNIT capsule Take 1,000 Units by mouth daily.   [name] MAGNESIUM PO Take 400 mg daily by mouth.   . Methylsulfonylmethane (MSM) 1000 MG CAPS Take daily by mouth.  . milk thistle 175 MG tablet Take 175 mg by mouth daily.  . vitamin C (ASCORBIC ACID) 500 MG tablet Take 500 mg daily by mouth.   . Vitamin D, Cholecalciferol, 400 units TABS Take 1,200 mg daily by mouth.    No facility-administered encounter medications on file as of 08/19/2018.     Activities of Daily Living In your present state of health, do you have any difficulty performing the following activities: 08/19/2018  Hearing? N  Vision? N  Comment Wears eye glasses.   Difficulty concentrating or making decisions? N  Walking or climbing stairs? N  Dressing or bathing? N  Doing errands, shopping? N  Preparing Food and eating ? N  Using the Toilet? N  In the past six months, have you accidently leaked urine? N  Do you have problems with loss of bowel control? N  Managing your Medications? N  Managing your Finances? N  Housekeeping or managing your Housekeeping? N  Some recent data might be hidden    Patient Care Team: Chrismon, Jodell Cipro, PA as PCP - General (Family Medicine)   Assessment:   This is a routine wellness examination for Walter [name].  Exercise Activities and Dietary recommendations Current Exercise Habits: The patient does not participate in regular exercise at present, Exercise limited by: None identified  Goals    . Eat more fruits and vegetables     Recommend eating 2 servings of fruits and vegetables daily.     . Exercise 3x per week (30 min per time)     Recommend to exercise for 3 days a week for at least 30 minutes at a time.       . Increase water intake     Starting 08/13/16, I  will increase my water intake to 5-6 glasses a day.       Fall Risk Fall Risk  08/19/2018 08/12/2017 08/13/2016  Falls in the past year? 0 No No   FALL RISK PREVENTION PERTAINING TO THE HOME:  Any stairs in or around the home WITH handrails? No  Home free of loose throw rugs in walkways, pet beds, electrical cords, etc? Yes  Adequate lighting in your home to reduce risk  of falls? Yes   ASSISTIVE DEVICES UTILIZED TO PREVENT FALLS:  Life alert? No  Use of a cane, walker or w/c? No  Grab bars in the bathroom? Yes  Shower chair or bench in shower? No  Elevated toilet seat or a handicapped toilet? No    TIMED UP AND GO:  Was the test performed? No .    Depression Screen PHQ 2/9 Scores 08/19/2018 08/19/2018 08/12/2017 08/13/2016  PHQ - 2 Score 0 0 0 0  PHQ- 9 Score 0 - - -    Cognitive Function     6CIT Screen 08/19/2018 08/13/2016  What Year? 0 points 0 points  What month? 0 points 0 points  What time? 0 points 0 points  Count back from 20 0 points 0 points  Months in reverse 4 points 0 points  Repeat phrase 4 points 4 points  Total Score 8 4    Immunization History  Administered Date(s) Administered  . Influenza Split 05/22/2013  . Influenza, High Dose Seasonal PF 08/15/2015, 06/25/2016, 07/08/2017, 06/25/2018  . Influenza,inj,Quad PF,6+ Mos 06/29/2014  . Pneumococcal Conjugate-13 08/15/2015  . Pneumococcal Polysaccharide-23 07/08/2017  . Tetanus 05/01/2016    Qualifies for Shingles Vaccine? Yes . Due for Shingrix. Education has been provided regarding the importance of this vaccine. Pt has been advised to call insurance company to determine out of pocket expense. Advised may also receive vaccine at local pharmacy or Health Dept. Verbalized acceptance and understanding.  Tdap: Up to date  Flu Vaccine: Up to date  Pneumococcal Vaccine: Up to date   Screening Tests Health Maintenance  Topic Date Due  . COLONOSCOPY  12/19/2023  . TETANUS/TDAP   05/01/2026  . INFLUENZA VACCINE  Completed  . Hepatitis C Screening  Completed  . PNA vac Low Risk Adult  Completed   Cancer Screenings:  Colorectal Screening: Completed 12/18/13. Repeat every 10 years.  Lung Cancer Screening: (Low Dose CT Chest recommended if Age 86-80 years, 30 pack-year currently smoking OR have quit w/in 15years.) does not qualify.    Additional Screening:  Hepatitis C Screening: Up to date  Vision Screening: Recommended annual ophthalmology exams for early detection of glaucoma and other disorders of the eye.  Dental Screening: Recommended annual dental exams for proper oral hygiene  Community Resource Referral:  CRR required this visit?  No        Plan:  I have personally reviewed and addressed the Medicare Annual Wellness questionnaire and have noted the following in the patient's chart:  A. Medical and social history B. Use of alcohol, tobacco or illicit drugs  C. Current medications and supplements D. Functional ability and status E.  Nutritional status F.  Physical activity G. Advance directives H. List of other physicians I.  Hospitalizations, surgeries, and ER visits in previous 12 months J.  Vitals K. Screenings such as hearing and vision if needed, cognitive and depression L. Referrals and appointments - none  In addition, I have reviewed and discussed with patient certain preventive protocols, quality metrics, and best practice recommendations. A written personalized care plan for preventive services as well as general preventive health recommendations were provided to patient.  See attached scanned questionnaire for additional information.   Signed,  Hyacinth Meeker, LPN Nurse Health Advisor   Nurse Recommendations: None.   Reviewed documentation and recommendation of Nurse Health Advisor's screening. Was available for consultation. Agree with note and plan.

## 2019-06-06 ENCOUNTER — Other Ambulatory Visit: Payer: Self-pay

## 2019-06-06 ENCOUNTER — Ambulatory Visit (INDEPENDENT_AMBULATORY_CARE_PROVIDER_SITE_OTHER): Payer: Medicare Other

## 2019-06-06 DIAGNOSIS — Z23 Encounter for immunization: Secondary | ICD-10-CM | POA: Diagnosis not present

## 2019-08-21 ENCOUNTER — Telehealth: Payer: Self-pay

## 2019-08-21 NOTE — Telephone Encounter (Signed)
It looks like he is schedule with you on 08/28/2019    Copied from [facility] #[id number]. Topic: Appointment Scheduling - Scheduling Inquiry for Clinic >> Aug 21, 2019 12:25 PM Oneta Rack wrote: Reason for CRM: Patient scheduled for 08/28/2019 AWV appt and would like to turn in office appt into virtual appt. Patient son will be present with his [facility] son # [phone]. Wife would like a follow up call to confirm if this is possible [phone].

## 2019-08-22 NOTE — Progress Notes (Deleted)
Subjective:   Walter [name] is a 68 y.o. male who presents for Medicare Annual/Subsequent preventive examination.    This visit is being conducted through telemedicine due to the COVID-19 pandemic. This patient has given me verbal consent via doximity to conduct this visit, patient states they are participating from their home address. Some vital signs may be absent or patient reported.    Patient identification: identified by name, DOB, and current address  Review of Systems:  N/A        Objective:    Vitals: There were no vitals taken for this visit.  There is no height or weight on file to calculate BMI. Unable to obtain vitals due to visit being conducted via telephonically.   Advanced Directives 08/19/2018 08/12/2017 08/13/2016  Does Patient Have a Medical Advance Directive? Yes Yes No  Type of Estate agent of [place];Living will Healthcare Power of [place];Living will -  Copy of Healthcare Power of Attorney in Chart? No - copy requested No - copy requested -  Would patient like information on creating a medical advance directive? - - No - patient declined information    Tobacco Social History   Tobacco Use  Smoking Status Never Smoker  Smokeless Tobacco Never Used     Counseling given: Not Answered   Clinical Intake:                       No past medical history on file. Past Surgical History:  Procedure Laterality Date  . TONSILLECTOMY AND ADENOIDECTOMY  1960   Family History  Problem Relation Age of Onset  . Hyperthyroidism Mother   . Lung cancer Father   . Heart attack Father   . Hypothyroidism Sister   . Coronary artery disease Brother   . Nephrolithiasis Brother   . Heart attack Maternal Grandfather   . Heart disease Paternal Grandmother   . Emphysema Paternal Grandfather    Social History   Socioeconomic History  . Marital status: Married    Spouse name: Not on file  . Number of children: 1  . Years of  education: Not on file  . Highest education level: GED or equivalent  Occupational History  . Occupation: retired  Engineer, production  . Financial resource strain: Not hard at all  . Food insecurity    Worry: Never true    Inability: Never true  . Transportation needs    Medical: No    Non-medical: No  Tobacco Use  . Smoking status: Never Smoker  . Smokeless tobacco: Never Used  Substance and Sexual Activity  . Alcohol use: No    Alcohol/week: 0.0 standard drinks    Frequency: Never  . Drug use: No  . Sexual activity: Not on file  Lifestyle  . Physical activity    Days per week: 0 days    Minutes per session: 0 min  . Stress: Not at all  Relationships  . Social Musician on phone: Patient refused    Gets together: Patient refused    Attends religious service: Patient refused    Active member of club or organization: Patient refused    Attends meetings of clubs or organizations: Patient refused    Relationship status: Patient refused  Other Topics Concern  . Not on file  Social History Narrative  . Not on file    Outpatient Encounter Medications as of 08/28/2019  Medication Sig  . ASHWAGANDHA PO Take 800 mg daily  by mouth.   . Cinnamon 500 MG capsule Take 500 mg by mouth daily.   . Coenzyme Q10 (COQ-10) 10 MG CAPS Take 100 mg daily by mouth.   . Cyanocobalamin (VITAMIN B-12) 2500 MCG SUBL Place 3 each daily under the tongue.   . cyanocobalamin 100 MCG tablet Take 100 mcg by mouth daily.   . Glucosamine-Chondroitin (OSTEO BI-FLEX REGULAR STRENGTH PO) Take by mouth daily.   [name]. MAGNESIUM PO Take 400 mg daily by mouth.   . Methylsulfonylmethane (MSM) 1000 MG CAPS Take daily by mouth.  . milk thistle 175 MG tablet Take 175 mg by mouth daily.  . naproxen sodium (ALEVE) 220 MG tablet Take 220 mg daily as needed by mouth.   . NON FORMULARY   . OMEGA-3 FATTY ACIDS PO Take 1,200 mg daily by mouth.   . Red Yeast Rice Extract (RED YEAST RICE PO) Take 600 mg daily by  mouth.   . sildenafil (REVATIO) 20 MG tablet Take 2-3 tablets by mouth 1-4 hours prior to intercourse.  . Turmeric 500 MG CAPS Take by mouth daily.   [name]. UNABLE TO FIND CBD 250 mg daily  . UNABLE TO FIND daily. Red beet powder   . vitamin C (ASCORBIC ACID) 500 MG tablet Take 500 mg daily by mouth.   . Vitamin D, Cholecalciferol, 400 units TABS Take 1,200 mg daily by mouth.   . vitamin E 1000 UNIT capsule Take 1,000 Units by mouth daily.    No facility-administered encounter medications on file as of 08/28/2019.     Activities of Daily Living No flowsheet data found.  Patient Care Team: Chrismon, Jodell Ciproennis E, PA as PCP - General (Family Medicine)   Assessment:   This is a routine wellness examination for Walter [name].  Exercise Activities and Dietary recommendations    Goals    . Eat more fruits and vegetables     Recommend eating 2 servings of fruits and vegetables daily.     . Exercise 3x per week (30 min per time)     Recommend to exercise for 3 days a week for at least 30 minutes at a time.       . Increase water intake     Starting 08/13/16, I will increase my water intake to 5-6 glasses a day.       Fall Risk: Fall Risk  08/19/2018 08/12/2017 08/13/2016  Falls in the past year? 0 No No    FALL RISK PREVENTION PERTAINING TO THE HOME:  Any stairs in or around the home? {YES/NO:21197} If so, are there any without handrails? {YES/NO:21197}  Home free of loose throw rugs in walkways, pet beds, electrical cords, etc? Yes  Adequate lighting in your home to reduce risk of falls? Yes   ASSISTIVE DEVICES UTILIZED TO PREVENT FALLS:  Life alert? {YES/NO:21197} Use of a cane, walker or w/c? {YES/NO:21197} Grab bars in the bathroom? {YES/NO:21197} Shower chair or bench in shower? {YES/NO:21197} Elevated toilet seat or a handicapped toilet? {YES/NO:21197}  TIMED UP AND GO:  Was the test performed? No .    Depression Screen PHQ 2/9 Scores 08/19/2018 08/19/2018 08/12/2017  08/13/2016  PHQ - 2 Score 0 0 0 0  PHQ- 9 Score 0 - - -    Cognitive Function     6CIT Screen 08/19/2018 08/13/2016  What Year? 0 points 0 points  What month? 0 points 0 points  What time? 0 points 0 points  Count back from 20 0 points 0 points  Months  in reverse 4 points 0 points  Repeat phrase 4 points 4 points  Total Score 8 4    Immunization History  Administered Date(s) Administered  . Fluad Quad(high Dose 65+) 06/06/2019  . Influenza Split 05/22/2013  . Influenza, High Dose Seasonal PF 08/15/2015, 06/25/2016, 07/08/2017, 06/25/2018  . Influenza,inj,Quad PF,6+ Mos 06/29/2014  . Pneumococcal Conjugate-13 08/15/2015  . Pneumococcal Polysaccharide-23 07/08/2017  . Tetanus 05/01/2016    Qualifies for Shingles Vaccine? Yes . Due for Shingrix. Education has been provided regarding the importance of this vaccine. Pt has been advised to call insurance company to determine out of pocket expense. Advised may also receive vaccine at local pharmacy or Health Dept. Verbalized acceptance and understanding.  Tdap: Up to date  Flu Vaccine: Up to date  Pneumococcal Vaccine: Completed series  Screening Tests Health Maintenance  Topic Date Due  . COLONOSCOPY  12/19/2023  . TETANUS/TDAP  05/01/2026  . INFLUENZA VACCINE  Completed  . Hepatitis C Screening  Completed  . PNA vac Low Risk Adult  Completed   Cancer Screenings:  Colorectal Screening: Completed 12/18/13. Repeat every 10 years.  Lung Cancer Screening: (Low Dose CT Chest recommended if Age 66-80 years, 30 pack-year currently smoking OR have quit w/in 15years.) {DOES NOT does:27190::"does not"} qualify.   Additional Screening:  Hepatitis C Screening: Up to date  Vision Screening: Recommended annual ophthalmology exams for early detection of glaucoma and other disorders of the eye.  Dental Screening: Recommended annual dental exams for proper oral hygiene  Community Resource Referral:  CRR required this visit?  No         Plan:  I have personally reviewed and addressed the Medicare Annual Wellness questionnaire and have noted the following in the patient's chart:  A. Medical and social history B. Use of alcohol, tobacco or illicit drugs  C. Current medications and supplements D. Functional ability and status E.  Nutritional status F.  Physical activity G. Advance directives H. List of other physicians I.  Hospitalizations, surgeries, and ER visits in previous 12 months J.  [place] such as hearing and vision if needed, cognitive and depression L. Referrals and appointments   In addition, I have reviewed and discussed with patient certain preventive protocols, quality metrics, and best practice recommendations. A written personalized care plan for preventive services as well as general preventive health recommendations were provided to patient.   Glendora Score, [place]  03/21/7627 Nurse Health Advisor   Nurse Notes: ***

## 2019-08-22 NOTE — Telephone Encounter (Signed)
Spoke with wife who states they only want to speak with Simona Huh and prefer it to be in office. Declined completing the AWV at this time due to other issues going on. Cancelled AWV for 08/28/19 and scheduled an OV with Simona Huh on the same day at 2:20 PM. FYI to PCP.

## 2019-08-28 ENCOUNTER — Ambulatory Visit (INDEPENDENT_AMBULATORY_CARE_PROVIDER_SITE_OTHER): Payer: Medicare Other | Admitting: Family Medicine

## 2019-08-28 ENCOUNTER — Other Ambulatory Visit: Payer: Self-pay

## 2019-08-28 ENCOUNTER — Ambulatory Visit: Payer: Medicare Other

## 2019-08-28 ENCOUNTER — Encounter: Payer: Self-pay | Admitting: Family Medicine

## 2019-08-28 VITALS — BP 164/84 | HR 64 | Temp 97.3°F | Wt 252.0 lb

## 2019-08-28 DIAGNOSIS — R413 Other amnesia: Secondary | ICD-10-CM

## 2019-08-28 MED ORDER — DONEPEZIL HCL 5 MG PO TABS: 5.0000 mg | ORAL_TABLET | Freq: Every day | ORAL | 0 refills | Status: DC

## 2019-08-28 NOTE — Progress Notes (Signed)
[name]  MRN: [id number] DOB: 16-Aug-1951  Subjective:  HPI   The patient is a 68 year old male who presents today with his son regarding memory issues.  He states that he noticed he has had issues with remembering names and numbers.  Patient Active Problem List   Diagnosis Date Noted  . Disease of prostate 01/13/2016  . Snores 01/13/2016  . Hyperlipidemia 01/13/2016  . Herpes zoster without complication 04/25/2010  . 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 Colon, diverticulosis 12/13/2006   No past medical history on file.  Social History   Socioeconomic History  . Marital status: Married    Spouse name: Not on file  . Number of children: 1  . Years of education: Not on file  . Highest education level: GED or equivalent  Occupational History  . Occupation: retired  Engineer, productionocial Needs  . Financial resource strain: Not hard at all  . Food insecurity    Worry: Never true    Inability: Never true  . Transportation needs    Medical: No    Non-medical: No  Tobacco Use  . Smoking status: Never Smoker  . Smokeless tobacco: Never Used  Substance and Sexual Activity  . Alcohol use: No    Alcohol/week: 0.0 standard drinks    Frequency: Never  . Drug use: No  . Sexual activity: Not on file  Lifestyle  . Physical activity    Days per week: 0 days    Minutes per session: 0 min  . Stress: Not at all  Relationships  . Social Musicianconnections    Talks on phone: Patient refused    Gets together: Patient refused    Attends religious service: Patient refused    Active member of club or organization: Patient refused    Attends meetings of clubs or organizations: Patient refused    Relationship status: Patient refused  . Intimate partner violence    Fear of current or ex partner: Patient refused    Emotionally abused: Patient refused    Physically abused: Patient refused    Forced sexual  activity: Patient refused  Other Topics Concern  . Not on file  Social History Narrative  . Not on file   Past Surgical History:  Procedure Laterality Date  . TONSILLECTOMY AND ADENOIDECTOMY  1960   Family History  Problem Relation Age of Onset  . Hyperthyroidism Mother   . Lung cancer Father   . Heart attack Father   . Hypothyroidism Sister   . Coronary artery disease Brother   . Nephrolithiasis Brother   . Heart attack Maternal Grandfather   . Heart disease Paternal Grandmother   . Emphysema Paternal Grandfather    Outpatient Encounter Medications as of 08/28/2019  Medication Sig Note  . ASHWAGANDHA PO Take 800 mg daily by mouth.    . Cyanocobalamin (VITAMIN B-12) 2500 MCG SUBL Place 3 each daily under the tongue.    [name]. MAGNESIUM PO Take 400 mg daily by mouth.    . OMEGA-3 FATTY ACIDS PO Take 1,200 mg daily by mouth.    . Red Yeast Rice Extract (RED YEAST RICE PO) Take 600 mg daily by mouth.    . Turmeric 500 MG CAPS Take by mouth daily.    [name]. UNABLE TO FIND CBD 250 mg daily   . UNABLE TO FIND daily. Red beet powder    . vitamin C (ASCORBIC ACID) 500 MG tablet  Take 500 mg daily by mouth.    . Vitamin D, Cholecalciferol, 400 units TABS Take 1,200 mg daily by mouth.    . vitamin E 1000 UNIT capsule Take 1,000 Units by mouth daily.    . Cinnamon 500 MG capsule Take 500 mg by mouth daily.    . Coenzyme Q10 (COQ-10) 10 MG CAPS Take 100 mg daily by mouth.    . cyanocobalamin 100 MCG tablet Take 100 mcg by mouth daily.    . Glucosamine-Chondroitin (OSTEO BI-FLEX REGULAR STRENGTH PO) Take by mouth daily.    . Methylsulfonylmethane (MSM) 1000 MG CAPS Take daily by mouth.   . milk thistle 175 MG tablet Take 175 mg by mouth daily.   . naproxen sodium (ALEVE) 220 MG tablet Take 220 mg daily as needed by mouth.  08/13/2017: PRN   . sildenafil (REVATIO) 20 MG tablet Take 2-3 tablets by mouth 1-4 hours prior to intercourse. (Patient not taking: Reported on 08/28/2019)   . [DISCONTINUED]  NON FORMULARY     No facility-administered encounter medications on file as of 08/28/2019.    Allergies  Allergen Reactions  . Antihistamines, Chlorpheniramine-Type    Review of Systems  Constitutional: Negative for chills, diaphoresis, fever and malaise/fatigue.  HENT: Negative for congestion, ear pain, sinus pain and sore throat.   Respiratory: Negative for cough, shortness of breath and wheezing.   Cardiovascular: Negative for chest pain and palpitations.  Gastrointestinal: Negative for abdominal pain and diarrhea.  Musculoskeletal: Negative for myalgias.  Neurological: Positive for dizziness. Negative for headaches.    Objective:  BP (!) 164/84 (BP Location: Right Arm, Patient Position: Sitting, Cuff Size: Normal)   Pulse 64   Temp (!) 97.3 F (36.3 C) (Skin)   Wt 252 lb (114.3 kg)   SpO2 99%   BMI 34.18 kg/m   Physical Exam  Constitutional: He is oriented to person, place, and time and well-developed, well-nourished, and in no distress.  HENT:  Head: Normocephalic.  Mouth/Throat: Oropharynx is clear and moist.  Eyes: Conjunctivae are normal.  Neck: Neck supple. No thyromegaly present.  Cardiovascular: Normal rate, regular rhythm and intact distal pulses.  No carotid bruits.  Pulmonary/Chest: Effort normal and breath sounds normal.  Abdominal: Soft. Bowel sounds are normal.  Musculoskeletal: Normal range of motion.  Neurological: He is alert and oriented to person, place, and time.  Skin: No rash noted.  Psychiatric: Mood, affect and judgment normal. He exhibits abnormal recent memory.  Notice difficulty with calculations, searching for words when talking and occasional difficulty with directions.   MMSE - Mini Mental State Exam 08/28/2019  Orientation to time 4  Orientation to Place 3  Registration 3  Attention/ Calculation 3  Recall 1  Language- name 2 objects 2  Language- repeat 0.5  Language- follow 3 step command 3  Language- read & follow direction 0.5   Write a sentence 1  Copy design 1  Total score 22     6CIT Screen 08/19/2018 08/13/2016  What Year? 0 points 0 points  What month? 0 points 0 points  What time? 0 points 0 points  Count back from 20 0 points 0 points  Months in reverse 4 points 0 points  Repeat phrase 4 points 4 points  Total Score 8 4        Assessment and Plan :  1. Memory changes Family and patient recognizes some memory changes - difficulty with directions, calculations and remembering some numbers. Still driving. Will get labs to check CBC,  Vitamin D level, CMP and TSH. May need referral to neurologist if no help from Aricept and all blood tests are normal. - CBC with Differential - Comprehensive Metabolic Panel (CMET) - TSH - Vitamin D (25 hydroxy) - donepezil (ARICEPT) 5 MG tablet; Take 1 tablet (5 mg total) by mouth at bedtime.  Dispense: 30 tablet; Refill: 0

## 2019-08-30 LAB — CBC WITH DIFFERENTIAL/PLATELET
Basophils Absolute: 0.1 10*3/uL (ref 0.0–0.2)
Basos: 1 %
EOS (ABSOLUTE): 0.3 10*3/uL (ref 0.0–0.4)
Eos: 4 %
Hematocrit: 43.6 % (ref 37.5–51.0)
Hemoglobin: 15.1 g/dL (ref 13.0–17.7)
Immature Grans (Abs): 0 10*3/uL (ref 0.0–0.1)
Immature Granulocytes: 0 %
Lymphocytes Absolute: 2.2 10*3/uL (ref 0.7–3.1)
Lymphs: 39 %
MCH: 32.6 pg (ref 26.6–33.0)
MCHC: 34.6 g/dL (ref 31.5–35.7)
MCV: 94 fL (ref 79–97)
Monocytes Absolute: 0.6 10*3/uL (ref 0.1–0.9)
Monocytes: 10 %
Neutrophils Absolute: 2.6 10*3/uL (ref 1.4–7.0)
Neutrophils: 46 %
Platelets: 202 10*3/uL (ref 150–450)
RBC: 4.63 x10E6/uL (ref 4.14–5.80)
RDW: 12.2 % (ref 11.6–15.4)
WBC: 5.6 10*3/uL (ref 3.4–10.8)

## 2019-08-30 LAB — COMPREHENSIVE METABOLIC PANEL WITH GFR
ALT: 16 [IU]/L (ref 0–44)
AST: 19 [IU]/L (ref 0–40)
Albumin/Globulin Ratio: 1.8 (ref 1.2–2.2)
Albumin: 4.5 g/dL (ref 3.8–4.8)
Alkaline Phosphatase: 102 [IU]/L (ref 39–117)
BUN/Creatinine Ratio: 6 — ABNORMAL LOW (ref 10–24)
BUN: 6 mg/dL — ABNORMAL LOW (ref 8–27)
Bilirubin Total: 0.7 mg/dL (ref 0.0–1.2)
CO2: 26 mmol/L (ref 20–29)
Calcium: 9.6 mg/dL (ref 8.6–10.2)
Chloride: 100 mmol/L (ref 96–106)
Creatinine, Ser: 0.94 mg/dL (ref 0.76–1.27)
GFR calc Af Amer: 95 mL/min/{1.73_m2}
GFR calc non Af Amer: 82 mL/min/{1.73_m2}
Globulin, Total: 2.5 g/dL (ref 1.5–4.5)
Glucose: 77 mg/dL (ref 65–99)
Potassium: 4.2 mmol/L (ref 3.5–5.2)
Sodium: 141 mmol/L (ref 134–144)
Total Protein: 7 g/dL (ref 6.0–8.5)

## 2019-08-30 LAB — TSH: TSH: 0.971 u[IU]/mL (ref 0.450–4.500)

## 2019-08-31 ENCOUNTER — Telehealth: Payer: Self-pay

## 2019-08-31 NOTE — Telephone Encounter (Signed)
-----   Message from Margo Common, [place] sent at 08/31/2019 12:39 PM EST ----- All labs normal. Awaiting Vitamin D level. Proceed with Aricept and recheck response in 3 months.

## 2019-08-31 NOTE — Telephone Encounter (Signed)
Tried calling patient. Left message to call back. OK for [facility] triage nurse to advise patient of results.  

## 2019-09-01 NOTE — Telephone Encounter (Signed)
Pt's wife given results per Dr Natale Milch, "All labs normal. Awaiting Vitamin D level. Proceed with Aricept and recheck response in 3 months."; she verbalized undersanding.

## 2019-09-04 NOTE — Progress Notes (Addendum)
Subjective:   Walter [name] is a 68 y.o. male who presents for Medicare Annual/Subsequent preventive examination.    This visit is being conducted through telemedicine due to the COVID-19 pandemic. This patient has given me verbal consent via doximity to conduct this visit, patient states they are participating from their home address. Some vital signs may be absent or patient reported.    Patient identification: identified by name, DOB, and current address  Review of Systems:  N/A  Cardiac Risk Factors include: advanced age (>41men, >57 women);dyslipidemia;male gender     Objective:    Vitals: There were no vitals taken for this visit.  There is no height or weight on file to calculate BMI. Unable to obtain vitals due to visit being conducted via telephonically.   Advanced Directives 09/05/2019 08/19/2018 08/12/2017 08/13/2016  Does Patient Have a Medical Advance Directive? Yes Yes Yes No  Type of Paramedic of [place];Living will [place];Living will [place];Living will -  Copy of [place] in Chart? No - copy requested No - copy requested No - copy requested -  Would patient like information on creating a medical advance directive? - - - No - patient declined information    Tobacco Social History   Tobacco Use  Smoking Status Never Smoker  Smokeless Tobacco Never Used     Counseling given: Not Answered   Clinical Intake:  Pre-visit preparation completed: Yes  Pain : No/denies pain Pain Score: 0-No pain     Nutritional Risks: None Diabetes: No  How often do you need to have someone help you when you read instructions, pamphlets, or other written materials from your doctor or pharmacy?: 1 - Never  Interpreter Needed?: No  Information entered by :: [facility], LPN  Past Medical History:  Diagnosis Date  . Hyperlipidemia    Past Surgical History:  Procedure Laterality Date   . TONSILLECTOMY AND ADENOIDECTOMY  1960   Family History  Problem Relation Age of Onset  . Hyperthyroidism Mother   . Lung cancer Father   . Heart attack Father   . Hypothyroidism Sister   . Coronary artery disease Brother   . Nephrolithiasis Brother   . Heart attack Maternal Grandfather   . Heart disease Paternal Grandmother   . Emphysema Paternal Grandfather    Social History   Socioeconomic History  . Marital status: Married    Spouse name: Not on file  . Number of children: 1  . Years of education: Not on file  . Highest education level: GED or equivalent  Occupational History  . Occupation: retired  Scientific laboratory technician  . Financial resource strain: Not hard at all  . Food insecurity    Worry: Never true    Inability: Never true  . Transportation needs    Medical: No    Non-medical: No  Tobacco Use  . Smoking status: Never Smoker  . Smokeless tobacco: Never Used  Substance and Sexual Activity  . Alcohol use: No    Alcohol/week: 0.0 standard drinks    Frequency: Never  . Drug use: No  . Sexual activity: Not on file  Lifestyle  . Physical activity    Days per week: 0 days    Minutes per session: 0 min  . Stress: Not at all  Relationships  . Social connections    Talks on phone: Patient refused    Gets together: Patient refused    Attends religious service: Patient refused  Active member of club or organization: Patient refused    Attends meetings of clubs or organizations: Patient refused    Relationship status: Patient refused  Other Topics Concern  . Not on file  Social History Narrative  . Not on file    Outpatient Encounter Medications as of 09/05/2019  Medication Sig  . Coenzyme Q10 (COQ-10) 10 MG CAPS Take 120 mg by mouth daily.   . Cyanocobalamin (VITAMIN B-12) 2500 MCG SUBL Place 3 each daily under the tongue.   . donepezil (ARICEPT) 5 MG tablet Take 1 tablet (5 mg total) by mouth at bedtime.  [name] MAGNESIUM PO Take 400 mg daily by mouth.   Gerome Sam Oleifera (MORINGA PO) Take by mouth daily.  . naproxen sodium (ALEVE) 220 MG tablet Take 220 mg daily as needed by mouth.   . NON FORMULARY daily. Natures Bounty Anxiety and Stress Relief with Ashwagandha  . OMEGA-3 FATTY ACIDS PO Take 1,200 mg daily by mouth.   . Red Yeast Rice Extract (RED YEAST RICE PO) Take 600 mg daily by mouth.   . Turmeric 500 MG CAPS Take by mouth daily.   [name] UNABLE TO FIND CBD 250 mg daily  . UNABLE TO FIND daily. Red beet powder   . vitamin C (ASCORBIC ACID) 500 MG tablet Take 500 mg daily by mouth.   . Vitamin D, Cholecalciferol, 400 units TABS Take 2,000 mg by mouth daily.   . Vitamin E 180 MG CAPS Take 180 mg by mouth daily.  . ASHWAGANDHA PO Take 800 mg by mouth daily.   . Cinnamon 500 MG capsule Take 500 mg by mouth daily.   . cyanocobalamin 100 MCG tablet Take 100 mcg by mouth daily.   . Glucosamine-Chondroitin (OSTEO BI-FLEX REGULAR STRENGTH PO) Take by mouth daily.   . Methylsulfonylmethane (MSM) 1000 MG CAPS Take daily by mouth.  . milk thistle 175 MG tablet Take 175 mg by mouth daily.  . sildenafil (REVATIO) 20 MG tablet Take 2-3 tablets by mouth 1-4 hours prior to intercourse. (Patient not taking: Reported on 08/28/2019)  . vitamin E 1000 UNIT capsule Take 180 Units by mouth daily.    No facility-administered encounter medications on file as of 09/05/2019.     Activities of Daily Living In your present state of health, do you have any difficulty performing the following activities: 09/05/2019  Hearing? N  Vision? N  Difficulty concentrating or making decisions? Y  Comment Currently on Aricept daily.  Walking or climbing stairs? N  Dressing or bathing? N  Doing errands, shopping? N  Preparing Food and eating ? N  Using the Toilet? N  In the past six months, have you accidently leaked urine? N  Do you have problems with loss of bowel control? N  Managing your Medications? N  Managing your Finances? N  Housekeeping or managing your  Housekeeping? N  Some recent data might be hidden    Patient Care Team: Chrismon, Jodell Cipro, PA as PCP - General (Family Medicine)   Assessment:   This is a routine wellness examination for Walter [name].  Exercise Activities and Dietary recommendations Current Exercise Habits: Home exercise routine, Type of exercise: treadmill;walking, Time (Minutes): 10, Frequency (Times/Week): 2, Weekly Exercise (Minutes/Week): 20, Intensity: Mild, Exercise limited by: None identified  Goals    . Eat more fruits and vegetables     Recommend eating 2 servings of fruits and vegetables daily.     . Exercise 3x per week (30 min per time)  Recommend to exercise for 3 days a week for at least 30 minutes at a time.       . Increase water intake     Starting 08/13/16, I will increase my water intake to 5-6 glasses a day.       Fall Risk: Fall Risk  09/05/2019 08/19/2018 08/12/2017 08/13/2016  Falls in the past year? 0 0 No No  Number falls in past yr: 0 - - -  Injury with Fall? 0 - - -    FALL RISK PREVENTION PERTAINING TO THE HOME:  Any stairs in or around the home? Yes  If so, are there any without handrails? No   Home free of loose throw rugs in walkways, pet beds, electrical cords, etc? Yes  Adequate lighting in your home to reduce risk of falls? Yes   ASSISTIVE DEVICES UTILIZED TO PREVENT FALLS:  Life alert? No  Use of a cane, walker or w/c? No  Grab bars in the bathroom? No  Shower chair or bench in shower? No  Elevated toilet seat or a handicapped toilet? No   TIMED UP AND GO:  Was the test performed? No .    Depression Screen PHQ 2/9 Scores 09/05/2019 08/19/2018 08/19/2018 08/12/2017  PHQ - 2 Score 1 0 0 0  PHQ- 9 Score - 0 - -    Cognitive Function: Not due today.   MMSE - Mini Mental State Exam 08/28/2019  Orientation to time 4  Orientation to Place 3  Registration 3  Attention/ Calculation 3  Recall 1  Language- name 2 objects 2  Language- repeat 0.5  Language-  follow 3 step command 3  Language- read & follow direction 0.5  Write a sentence 1  Copy design 1  Total score 22     6CIT Screen 08/19/2018 08/13/2016  What Year? 0 points 0 points  What month? 0 points 0 points  What time? 0 points 0 points  Count back from 20 0 points 0 points  Months in reverse 4 points 0 points  Repeat phrase 4 points 4 points  Total Score 8 4    Immunization History  Administered Date(s) Administered  . Fluad Quad(high Dose 65+) 06/06/2019  . Influenza Split 05/22/2013  . Influenza, High Dose Seasonal PF 08/15/2015, 06/25/2016, 07/08/2017, 06/25/2018  . Influenza,inj,Quad PF,6+ Mos 06/29/2014  . Pneumococcal Conjugate-13 08/15/2015  . Pneumococcal Polysaccharide-23 07/08/2017  . Tetanus 05/01/2016    Qualifies for Shingles Vaccine? Yes . Pt has been advised to call insurance company to determine out of pocket expense. Advised may also receive vaccine at local pharmacy or Health Dept. Verbalized acceptance and understanding.  Tdap: Up to date  Flu Vaccine: Up to date  Pneumococcal Vaccine: Completed series  Screening Tests Health Maintenance  Topic Date Due  . COLONOSCOPY  12/19/2023  . TETANUS/TDAP  05/01/2026  . INFLUENZA VACCINE  Completed  . Hepatitis C Screening  Completed  . PNA vac Low Risk Adult  Completed   Cancer Screenings:  Colorectal Screening: Completed 12/18/13. Repeat every 10 years.  Lung Cancer Screening: (Low Dose CT Chest recommended if Age 14-80 years, 30 pack-year currently smoking OR have quit w/in 15years.) does not qualify.   Additional Screening:  Hepatitis C Screening: Up to date  Vision Screening: Recommended annual ophthalmology exams for early detection of glaucoma and other disorders of the eye.  Dental Screening: Recommended annual dental exams for proper oral hygiene  Community Resource Referral:  CRR required this visit?  No  Plan:  I have personally reviewed and addressed the Medicare Annual  Wellness questionnaire and have noted the following in the patient's chart:  A. Medical and social history B. Use of alcohol, tobacco or illicit drugs  C. Current medications and supplements D. Functional ability and status E.  Nutritional status F.  Physical activity G. Advance directives H. List of other physicians I.  Hospitalizations, surgeries, and ER visits in previous 12 months J.  Vitals K. Screenings such as hearing and vision if needed, cognitive and depression L. Referrals and appointments   In addition, I have reviewed and discussed with patient certain preventive protocols, quality metrics, and best practice recommendations. A written personalized care plan for preventive services as well as general preventive health recommendations were provided to patient.   Darrick HuntsmanSigned,   Xeng Kucher, LPN  16/1/096012/04/2019 Nurse Health Advisor   Nurse Notes: None.  Reviewed note from Nurse Health Advisor's screening. Was available for consultation. Agree with documentation and recommendations.

## 2019-09-05 ENCOUNTER — Other Ambulatory Visit: Payer: Self-pay

## 2019-09-05 ENCOUNTER — Ambulatory Visit (INDEPENDENT_AMBULATORY_CARE_PROVIDER_SITE_OTHER): Payer: Medicare Other

## 2019-09-05 DIAGNOSIS — Z0000 Encounter for general adult medical examination without abnormal findings: Secondary | ICD-10-CM | POA: Diagnosis not present

## 2019-09-05 NOTE — Patient Instructions (Signed)
Walter [name] , Thank you for taking time to come for your Medicare Wellness Visit. I appreciate your ongoing commitment to your health goals. Please review the following plan we discussed and let me know if I can assist you in the future.   Screening recommendations/referrals: Colonoscopy: Up to date, due 11/2023 Recommended yearly ophthalmology/optometry visit for glaucoma screening and checkup Recommended yearly dental visit for hygiene and checkup  Vaccinations: Influenza vaccine: Up to date Pneumococcal vaccine: Completed series Tdap vaccine: Up to date, due 05/01/26 Shingles vaccine: Pt declines today.     Advanced directives: Please bring a copy of your POA (Power of Attorney) and/or Living Will to your next appointment.   Conditions/risks identified: Recommend to increase water intake to 6-8 8 oz glasses a day.   Next appointment: 09/09/20 @ 2:40 PM for AWV.  Declined scheduling a follow up with PCP at this time.   Preventive Care 16 Years and Older, Male Preventive care refers to lifestyle choices and visits with your health care provider that can promote health and wellness. What does preventive care include?  A yearly physical exam. This is also called an annual well check.  Dental exams once or twice a year.  Routine eye exams. Ask your health care provider how often you should have your eyes checked.  Personal lifestyle choices, including:  Daily care of your teeth and gums.  Regular physical activity.  Eating a healthy diet.  Avoiding tobacco and drug use.  Limiting alcohol use.  Practicing safe sex.  Taking low doses of aspirin every day.  Taking vitamin and mineral supplements as recommended by your health care provider. What happens during an annual well check? The services and screenings done by your health care provider during your annual well check will depend on your age, overall health, lifestyle risk factors, and family history of disease. Counseling   Your health care provider may ask you questions about your:  Alcohol use.  Tobacco use.  Drug use.  Emotional well-being.  Home and relationship well-being.  Sexual activity.  Eating habits.  History of falls.  Memory and ability to understand (cognition).  Work and work Statistician. Screening  You may have the following tests or measurements:  Height, weight, and BMI.  Blood pressure.  Lipid and cholesterol levels. These may be checked every 5 years, or more frequently if you are over 60 years old.  Skin check.  Lung cancer screening. You may have this screening every year starting at age 39 if you have a 30-pack-year history of smoking and currently smoke or have quit within the past 15 years.  Fecal occult blood test (FOBT) of the stool. You may have this test every year starting at age 32.  Flexible sigmoidoscopy or colonoscopy. You may have a sigmoidoscopy every 5 years or a colonoscopy every 10 years starting at age 60.  Prostate cancer screening. Recommendations will vary depending on your family history and other risks.  Hepatitis C blood test.  Hepatitis B blood test.  Sexually transmitted disease (STD) testing.  Diabetes screening. This is done by checking your blood sugar (glucose) after you have not eaten for a while (fasting). You may have this done every 1-3 years.  Abdominal aortic aneurysm (AAA) screening. You may need this if you are a current or former smoker.  Osteoporosis. You may be screened starting at age 29 if you are at high risk. Talk with your health care provider about your test results, treatment options, and if necessary, the  need for more tests. Vaccines  Your health care provider may recommend certain vaccines, such as:  Influenza vaccine. This is recommended every year.  Tetanus, diphtheria, and acellular pertussis (Tdap, Td) vaccine. You may need a Td booster every 10 years.  Zoster vaccine. You may need this after age 54.   Pneumococcal 13-valent conjugate (PCV13) vaccine. One dose is recommended after age 60.  Pneumococcal polysaccharide (PPSV23) vaccine. One dose is recommended after age 48. Talk to your health care provider about which screenings and vaccines you need and how often you need them. This information is not intended to replace advice given to you by your health care provider. Make sure you discuss any questions you have with your health care provider. Document Released: 10/11/2015 Document Revised: 06/03/2016 Document Reviewed: 07/16/2015 Elsevier Interactive Patient Education  2017 [place] Prevention in the Home Falls can cause injuries. They can happen to people of all ages. There are many things you can do to make your home safe and to help prevent falls. What can I do on the outside of my home?  Regularly fix the edges of walkways and driveways and fix any cracks.  Remove anything that might make you trip as you walk through a door, such as a raised step or threshold.  Trim any bushes or trees on the path to your home.  Use bright outdoor lighting.  Clear any walking paths of anything that might make someone trip, such as rocks or tools.  Regularly check to see if handrails are loose or broken. Make sure that both sides of any steps have handrails.  Any raised decks and porches should have guardrails on the edges.  Have any leaves, snow, or ice cleared regularly.  Use sand or salt on walking paths during winter.  Clean up any spills in your garage right away. This includes oil or grease spills. What can I do in the bathroom?  Use night lights.  Install grab bars by the toilet and in the tub and shower. Do not use towel bars as grab bars.  Use non-skid mats or decals in the tub or shower.  If you need to sit down in the shower, use a plastic, non-slip stool.  Keep the floor dry. Clean up any water that spills on the floor as soon as it happens.  Remove soap  buildup in the tub or shower regularly.  Attach bath mats securely with double-sided non-slip rug tape.  Do not have throw rugs and other things on the floor that can make you trip. What can I do in the bedroom?  Use night lights.  Make sure that you have a light by your bed that is easy to reach.  Do not use any sheets or blankets that are too big for your bed. They should not hang down onto the floor.  Have a firm chair that has side arms. You can use this for support while you get dressed.  Do not have throw rugs and other things on the floor that can make you trip. What can I do in the kitchen?  Clean up any spills right away.  Avoid walking on wet floors.  Keep items that you use a lot in easy-to-reach places.  If you need to reach something above you, use a strong step stool that has a grab bar.  Keep electrical cords out of the way.  Do not use floor polish or wax that makes floors slippery. If you must use wax,  use non-skid floor wax.  Do not have throw rugs and other things on the floor that can make you trip. What can I do with my stairs?  Do not leave any items on the stairs.  Make sure that there are handrails on both sides of the stairs and use them. Fix handrails that are broken or loose. Make sure that handrails are as long as the stairways.  Check any carpeting to make sure that it is firmly attached to the stairs. Fix any carpet that is loose or worn.  Avoid having throw rugs at the top or bottom of the stairs. If you do have throw rugs, attach them to the floor with carpet tape.  Make sure that you have a light switch at the top of the stairs and the bottom of the stairs. If you do not have them, ask someone to add them for you. What else can I do to help prevent falls?  Wear shoes that:  Do not have high heels.  Have rubber bottoms.  Are comfortable and fit you well.  Are closed at the toe. Do not wear sandals.  If you use a stepladder:  Make  sure that it is fully opened. Do not climb a closed stepladder.  Make sure that both sides of the stepladder are locked into place.  Ask someone to hold it for you, if possible.  Clearly mark and make sure that you can see:  Any grab bars or handrails.  First and last steps.  Where the edge of each step is.  Use tools that help you move around (mobility aids) if they are needed. These include:  Canes.  Walkers.  Scooters.  Crutches.  Turn on the lights when you go into a dark area. Replace any light bulbs as soon as they burn out.  Set up your furniture so you have a clear path. Avoid moving your furniture around.  If any of your floors are uneven, fix them.  If there are any pets around you, be aware of where they are.  Review your medicines with your doctor. Some medicines can make you feel dizzy. This can increase your chance of falling. Ask your doctor what other things that you can do to help prevent falls. This information is not intended to replace advice given to you by your health care provider. Make sure you discuss any questions you have with your health care provider. Document Released: 07/11/2009 Document Revised: 02/20/2016 Document Reviewed: 10/19/2014 Elsevier Interactive Patient Education  2017 [employer].

## 2019-09-25 ENCOUNTER — Other Ambulatory Visit: Payer: Self-pay | Admitting: Family Medicine

## 2019-09-25 DIAGNOSIS — R413 Other amnesia: Secondary | ICD-10-CM

## 2019-12-04 ENCOUNTER — Encounter: Payer: Self-pay | Admitting: Family Medicine

## 2019-12-04 ENCOUNTER — Ambulatory Visit (INDEPENDENT_AMBULATORY_CARE_PROVIDER_SITE_OTHER): Payer: Medicare Other | Admitting: Family Medicine

## 2019-12-04 ENCOUNTER — Other Ambulatory Visit: Payer: Self-pay

## 2019-12-04 VITALS — BP 174/90 | HR 82 | Temp 97.3°F | Wt 243.0 lb

## 2019-12-04 DIAGNOSIS — R251 Tremor, unspecified: Secondary | ICD-10-CM

## 2019-12-04 DIAGNOSIS — R413 Other amnesia: Secondary | ICD-10-CM

## 2019-12-04 NOTE — Progress Notes (Signed)
Walter [name]  MRN: [id number] DOB: [birth year]  Subjective:  HPI   The patient is a 69 year old male who presents for follow up.  He was last seen no 09/05/19 by the nurse health advisor.  Prior to that he was seen on 08/28/2019 for changes in his memory noticed by family and patient. Was started on Aricept 5 mg qd but family reports he did not want to take it due to cost. Also, having shaking in the right hand when trying to write or eat. Difficulty recalling words but more issues with numbers. Has halting speech today with flight of ideas.     Patient Active Problem List   Diagnosis Date Noted  . Disease of prostate 01/13/2016  . Snores 01/13/2016  . Hyperlipidemia 01/13/2016  . Herpes zoster without complication 04/25/2010  . 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 Colon, diverticulosis 12/13/2006    Past Medical History:  Diagnosis Date  . Hyperlipidemia    Past Surgical History:  Procedure Laterality Date  . TONSILLECTOMY AND ADENOIDECTOMY  1960   Family History  Problem Relation Age of Onset  . Hyperthyroidism Mother   . Lung cancer Father   . Heart attack Father   . Hypothyroidism Sister   . Coronary artery disease Brother   . Nephrolithiasis Brother   . Heart attack Maternal Grandfather   . Heart disease Paternal Grandmother   . Emphysema Paternal Grandfather    Social History   Socioeconomic History  . Marital status: Married    Spouse name: Not on file  . Number of children: 1  . Years of education: Not on file  . Highest education level: GED or equivalent  Occupational History  . Occupation: retired  Tobacco Use  . Smoking status: Never Smoker  . Smokeless tobacco: Never Used  Substance and Sexual Activity  . Alcohol use: No    Alcohol/week: 0.0 standard drinks  . Drug use: No  . Sexual activity: Not on file  Other Topics Concern  . Not on file  Social  History Narrative  . Not on file   Social Determinants of Health   Financial Resource Strain:   . Difficulty of Paying Living Expenses: Not on file  Food Insecurity:   . Worried About Programme researcher, broadcasting/film/video in the Last Year: Not on file  . Ran Out of Food in the Last Year: Not on file  Transportation Needs:   . Lack of Transportation (Medical): Not on file  . Lack of Transportation (Non-Medical): Not on file  Physical Activity:   . Days of Exercise per Week: Not on file  . Minutes of Exercise per Session: Not on file  Stress:   . Feeling of Stress : Not on file  Social Connections:   . Frequency of Communication with Friends and Family: Not on file  . Frequency of Social Gatherings with Friends and Family: Not on file  . Attends Religious Services: Not on file  . Active Member of Clubs or Organizations: Not on file  . Attends Banker Meetings: Not on file  . Marital Status: Not on file  Intimate Partner Violence:   . Fear of Current or Ex-Partner: Not on file  . Emotionally Abused: Not on file  . Physically Abused: Not on file  . Sexually Abused: Not on file    Outpatient Encounter Medications as of 12/04/2019  Medication Sig Note  .  ASHWAGANDHA PO Take 800 mg by mouth daily.    . Cinnamon 500 MG capsule Take 500 mg by mouth daily.    . Coenzyme Q10 (COQ-10) 10 MG CAPS Take 120 mg by mouth daily.    . Cyanocobalamin (VITAMIN B-12) 2500 MCG SUBL Place 3 each daily under the tongue.    . cyanocobalamin 100 MCG tablet Take 100 mcg by mouth daily.    [name] donepezil (ARICEPT) 5 MG tablet TAKE 1 TABLET BY MOUTH EVERYDAY AT BEDTIME   . Glucosamine-Chondroitin (OSTEO BI-FLEX REGULAR STRENGTH PO) Take by mouth daily.    [name] MAGNESIUM PO Take 400 mg daily by mouth.    . Methylsulfonylmethane (MSM) 1000 MG CAPS Take daily by mouth.   . milk thistle 175 MG tablet Take 175 mg by mouth daily.   Gerome Sam Oleifera (MORINGA PO) Take by mouth daily.   . naproxen sodium (ALEVE) 220 MG  tablet Take 220 mg daily as needed by mouth.  08/13/2017: PRN   . NON FORMULARY daily. Natures Bounty Anxiety and Stress Relief with Ashwagandha   . OMEGA-3 FATTY ACIDS PO Take 1,200 mg daily by mouth.    . Red Yeast Rice Extract (RED YEAST RICE PO) Take 600 mg daily by mouth.    . sildenafil (REVATIO) 20 MG tablet Take 2-3 tablets by mouth 1-4 hours prior to intercourse. (Patient not taking: Reported on 08/28/2019)   . Turmeric 500 MG CAPS Take by mouth daily.    [name] UNABLE TO FIND CBD 250 mg daily   . UNABLE TO FIND daily. Red beet powder    . vitamin C (ASCORBIC ACID) 500 MG tablet Take 500 mg daily by mouth.    . Vitamin D, Cholecalciferol, 400 units TABS Take 2,000 mg by mouth daily.    . vitamin E 1000 UNIT capsule Take 180 Units by mouth daily.    . Vitamin E 180 MG CAPS Take 180 mg by mouth daily.    No facility-administered encounter medications on file as of 12/04/2019.    Allergies  Allergen Reactions  . Antihistamines, Chlorpheniramine-Type    Review of Systems  Constitutional: Negative.   HENT: Negative.   Eyes: Negative.        Wears corrective lenses.  Respiratory: Negative.   Cardiovascular: Negative.   Gastrointestinal: Negative.   Genitourinary: Negative.   Musculoskeletal: Negative.   Psychiatric/Behavioral: Positive for memory loss.      Objective:  BP (!) 174/90 (BP Location: Right Arm, Patient Position: Sitting, Cuff Size: Normal)   Pulse 82   Temp (!) 97.3 F (36.3 C) (Skin)   Wt 243 lb (110.2 kg)   SpO2 98%   BMI 32.96 kg/m   Physical Exam  Constitutional: He is well-developed, well-nourished, and in no distress.  HENT:  Head: Normocephalic.  Eyes: Conjunctivae are normal.  Cardiovascular: Normal rate and regular rhythm.  Pulmonary/Chest: Effort normal and breath sounds normal.  Abdominal: Soft. Bowel sounds are normal.  Musculoskeletal:        General: Normal range of motion.     Cervical back: Neck supple.  Neurological: He is alert. He has  normal reflexes.  Memory loss with disorientation. Shaking of the right hand when trying to write or eat. Has to hold the hand steady with the left hand. No tremor noted at rest.  Skin: No rash noted.  Psychiatric: Mood, affect and judgment normal. He exhibits abnormal new learning ability.   MMSE - Mini Mental State Exam 12/04/2019 08/28/2019  Not completed: (  No Data) -  Orientation to time 2 4  Orientation to Place 4 3  Registration 2 3  Attention/ Calculation 1 3  Recall 0 1  Language- name 2 objects 2 2  Language- repeat 1 0.5  Language- follow 3 step command 0 3  Language- read & follow direction 0 0.5  Write a sentence 0 1  Copy design 0 1  Total score 12 22     Assessment and Plan :   1. Memory changes Did not take the Aricept due to cost. Family and patient recognizes memory issues primarily with remembering numbers or names. Could not complete MMSE and score has dropped significantly from 08-28-19. Will recheck routine labs and schedule neurology referral for evaluation of memory loss/dementia. - Ambulatory referral to Neurology - CBC with Differential/Platelet - Comprehensive metabolic panel - TSH - VITAMIN D 25 Hydroxy (Vit-D Deficiency, Fractures)  2. 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 Has to hold the right hand steady with the left to write/draw. Still taking Vitamin-D 2000 IU qd. No tremor at rest or in the left hand. Will get routine labs and Vitamin-D blood level. Schedule neurology referral. - Ambulatory referral to Neurology - CBC with Differential/Platelet - Comprehensive metabolic panel - TSH - VITAMIN D 25 Hydroxy (Vit-D Deficiency, Fractures)

## 2019-12-05 ENCOUNTER — Telehealth: Payer: Self-pay

## 2019-12-05 LAB — CBC WITH DIFFERENTIAL/PLATELET
Basophils Absolute: 0 10*3/uL (ref 0.0–0.2)
Basos: 1 %
EOS (ABSOLUTE): 0.2 10*3/uL (ref 0.0–0.4)
Eos: 3 %
Hematocrit: 43.4 % (ref 37.5–51.0)
Hemoglobin: 15.2 g/dL (ref 13.0–17.7)
Immature Grans (Abs): 0 10*3/uL (ref 0.0–0.1)
Immature Granulocytes: 0 %
Lymphocytes Absolute: 1.8 10*3/uL (ref 0.7–3.1)
Lymphs: 36 %
MCH: 32.5 pg (ref 26.6–33.0)
MCHC: 35 g/dL (ref 31.5–35.7)
MCV: 93 fL (ref 79–97)
Monocytes Absolute: 0.5 10*3/uL (ref 0.1–0.9)
Monocytes: 11 %
Neutrophils Absolute: 2.5 10*3/uL (ref 1.4–7.0)
Neutrophils: 49 %
Platelets: 208 10*3/uL (ref 150–450)
RBC: 4.68 x10E6/uL (ref 4.14–5.80)
RDW: 12.1 % (ref 11.6–15.4)
WBC: 5 10*3/uL (ref 3.4–10.8)

## 2019-12-05 LAB — COMPREHENSIVE METABOLIC PANEL
ALT: 17 IU/L (ref 0–44)
AST: 21 IU/L (ref 0–40)
Albumin/Globulin Ratio: 1.7 (ref 1.2–2.2)
Albumin: 4.3 g/dL (ref 3.8–4.8)
Alkaline Phosphatase: 88 IU/L (ref 39–117)
BUN/Creatinine Ratio: 9 — ABNORMAL LOW (ref 10–24)
BUN: 9 mg/dL (ref 8–27)
Bilirubin Total: 0.9 mg/dL (ref 0.0–1.2)
CO2: 26 mmol/L (ref 20–29)
Calcium: 9.7 mg/dL (ref 8.6–10.2)
Chloride: 101 mmol/L (ref 96–106)
Creatinine, Ser: 0.97 mg/dL (ref 0.76–1.27)
GFR calc Af Amer: 92 mL/min/{1.73_m2} (ref >59)
GFR calc non Af Amer: 79 mL/min/{1.73_m2} (ref >59)
Globulin, Total: 2.5 g/dL (ref 1.5–4.5)
Glucose: 82 mg/dL (ref 65–99)
Potassium: 4.9 mmol/L (ref 3.5–5.2)
Sodium: 140 mmol/L (ref 134–144)
Total Protein: 6.8 g/dL (ref 6.0–8.5)

## 2019-12-05 LAB — TSH: TSH: 0.858 u[IU]/mL (ref 0.450–4.500)

## 2019-12-05 LAB — VITAMIN D 25 HYDROXY (VIT D DEFICIENCY, FRACTURES): Vit D, 25-Hydroxy: 38.5 ng/mL (ref 30.0–100.0)

## 2019-12-05 NOTE — Telephone Encounter (Signed)
Spoke to patient's wife- notified of lab results and recommendations. She will recheck in 1 week if not heard anything about the appointment.

## 2019-12-05 NOTE — Telephone Encounter (Signed)
-----   Message from Tamsen Roers, [place] sent at 12/05/2019  2:23 PM EST ----- All blood tess essentially normal. Only low normal Vitamin-D level. Need to continue Vitamin-D 1000 IU daily and proceed with evaluation by neurologist. Scheduling staff will contack him when appointment is set.

## 2019-12-05 NOTE — Telephone Encounter (Signed)
LMTCB 12/05/2019.  PEC please advise pt of lab results below.     Thanks,   -Erricka Falkner 

## 2019-12-27 NOTE — Progress Notes (Signed)
Patient: Walter [name] Male    DOB: [birth year]   69 y.o.   MRN: [id number] Visit Date: 12/28/2019  Today's Provider: Vernie Murders, PA   Chief Complaint  Patient presents with  . GI Problem   Subjective:     GI Problem The primary symptoms include fatigue and abdominal pain. Primary symptoms do not include nausea or vomiting. The illness began 3 to 5 days ago. The onset was gradual.  The illness is also significant for bloating. The illness does not include constipation. Significant associated medical issues include diverticulitis.   Patient has hiatal hernia. No diarrhea or blood in stools.  Past Medical History:  Diagnosis Date  . Hyperlipidemia    Patient Active Problem List   Diagnosis Date Noted  . Disease of prostate 01/13/2016  . Snores 01/13/2016  . Hyperlipidemia 01/13/2016  . Herpes zoster without complication 63/33/5456  . 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 Colon, diverticulosis 12/13/2006   Past Surgical History:  Procedure Laterality Date  . TONSILLECTOMY AND ADENOIDECTOMY  1960   Family History  Problem Relation Age of Onset  . Hyperthyroidism Mother   . Lung cancer Father   . Heart attack Father   . Hypothyroidism Sister   . Coronary artery disease Brother   . Nephrolithiasis Brother   . Heart attack Maternal Grandfather   . Heart disease Paternal Grandmother   . Emphysema Paternal Grandfather     Allergies  Allergen Reactions  . Antihistamines, Chlorpheniramine-Type     Current Outpatient Medications:  .  ASHWAGANDHA PO, Take 800 mg by mouth daily. , Disp: , Rfl:  .  Coenzyme Q10 (COQ-10) 10 MG CAPS, Take 120 mg by mouth daily. , Disp: , Rfl:  .  Cyanocobalamin (VITAMIN B-12) 2500 MCG SUBL, Place 3 each daily under the tongue. , Disp: , Rfl:  .  cyanocobalamin 100 MCG tablet, Take 100 mcg by mouth daily. , Disp: , Rfl:  .   Glucosamine-Chondroitin (OSTEO BI-FLEX REGULAR STRENGTH PO), Take by mouth daily. , Disp: , Rfl:  .  MAGNESIUM PO, Take 400 mg daily by mouth. , Disp: , Rfl:  .  Moringa Oleifera (MORINGA PO), Take by mouth daily., Disp: , Rfl:  .  NON FORMULARY, daily. Natures Bounty Anxiety and Stress Relief with Ashwagandha, Disp: , Rfl:  .  OMEGA-3 FATTY ACIDS PO, Take 1,200 mg daily by mouth. , Disp: , Rfl:  .  Red Yeast Rice Extract (RED YEAST RICE PO), Take 600 mg daily by mouth. , Disp: , Rfl:  .  Turmeric 500 MG CAPS, Take by mouth daily. , Disp: , Rfl:  .  UNABLE TO FIND, CBD 250 mg daily, Disp: , Rfl:  .  UNABLE TO FIND, daily. Red beet powder , Disp: , Rfl:  .  vitamin C (ASCORBIC ACID) 500 MG tablet, Take 500 mg daily by mouth. , Disp: , Rfl:  .  Vitamin D, Cholecalciferol, 400 units TABS, Take 2,000 mg by mouth daily. , Disp: , Rfl:  .  vitamin E 1000 UNIT capsule, Take 180 Units by mouth daily. , Disp: , Rfl:  .  Vitamin E 180 MG CAPS, Take 180 mg by mouth daily., Disp: , Rfl:  .  Cinnamon 500 MG capsule, Take 500 mg by mouth daily. , Disp: , Rfl:  .  Methylsulfonylmethane (MSM) 1000 MG CAPS, Take daily by mouth., Disp: , Rfl:  .  milk thistle 175 MG tablet, Take 175 mg by mouth daily., Disp: , Rfl:  .  naproxen sodium (ALEVE) 220 MG tablet, Take 220 mg daily as needed by mouth. , Disp: , Rfl:   Review of Systems  Constitutional: Positive for fatigue.  Gastrointestinal: Positive for abdominal pain and bloating. Negative for constipation, nausea and vomiting.    Social History   Tobacco Use  . Smoking status: Never Smoker  . Smokeless tobacco: Never Used  Substance Use Topics  . Alcohol use: No    Alcohol/week: 0.0 standard drinks      Objective:   BP 131/79 (BP Location: Right Arm, Patient Position: Sitting, Cuff Size: Large)   Pulse 74   Temp (!) 97.1 F (36.2 C) (Temporal)   Wt 236 lb 3.2 oz (107.1 kg)   BMI 32.03 kg/m  Vitals:   12/28/19 0847  BP: 131/79  Pulse: 74    Temp: (!) 97.1 F (36.2 C)  TempSrc: Temporal  Weight: 236 lb 3.2 oz (107.1 kg)  Body mass index is 32.03 kg/m.  Physical Exam Constitutional:      General: He is not in acute distress.    Appearance: He is well-developed.  HENT:     Head: Normocephalic and atraumatic.     Right Ear: Hearing normal.     Left Ear: Hearing normal.     Nose: Nose normal.  Eyes:     General: Lids are normal. No scleral icterus.       Right eye: No discharge.        Left eye: No discharge.     Conjunctiva/sclera: Conjunctivae normal.  Cardiovascular:     Rate and Rhythm: Normal rate and regular rhythm.     Heart sounds: Normal heart sounds.  Pulmonary:     Effort: Pulmonary effort is normal. No respiratory distress.  Abdominal:     General: There is no distension.     Palpations: Abdomen is soft.     Tenderness: There is no abdominal tenderness.     Comments: Slightly hyperactive bowel sounds. No tympany to percussion.  Musculoskeletal:        General: Normal range of motion.  Skin:    Findings: No lesion or rash.  Neurological:     Mental Status: He is alert and oriented to person, place, and time.  Psychiatric:        Speech: Speech normal.        Behavior: Behavior normal.        Thought Content: Thought content normal.       Assessment & Plan    1. Gastroenteritis Gas and indigestion with hyperactive bowels over the past 3-4 days. No fever or COVID symptoms. No diarrhea or constipation. Some slight relief from Rolaids. Recommend switching to Pepto-Bismol and limit spicy foods. Increase water intake. States he has had loss of taste for the past 5-6 years. Recheck prn.     Dortha Kern, PA  [facility]

## 2019-12-28 ENCOUNTER — Other Ambulatory Visit: Payer: Self-pay

## 2019-12-28 ENCOUNTER — Encounter: Payer: Self-pay | Admitting: Family Medicine

## 2019-12-28 ENCOUNTER — Ambulatory Visit (INDEPENDENT_AMBULATORY_CARE_PROVIDER_SITE_OTHER): Payer: Medicare Other | Admitting: Family Medicine

## 2019-12-28 VITALS — BP 131/79 | HR 74 | Temp 97.1°F | Wt 236.2 lb

## 2019-12-28 DIAGNOSIS — K529 Noninfective gastroenteritis and colitis, unspecified: Secondary | ICD-10-CM

## 2019-12-28 NOTE — Patient Instructions (Signed)
Indigestion Indigestion is a feeling of pain, discomfort, burning, or fullness in the upper part of your belly (abdomen). It can come and go. It may occur often or rarely. Indigestion tends to happen while you are eating or right after you have finished eating. Indigestion may be a symptom of another condition. It may be worse:  At night.  When bending over.  While lying down. Follow these instructions at home: Eating and drinking   Follow an eating plan as told by your doctor.  You may need to avoid foods and drinks such as: ? Chocolate and cocoa. ? Peppermint and mint flavorings. ? Garlic and onions. ? Horseradish. ? Spicy and acidic foods, such as:  Peppers.  Chili powder and curry powder.  Vinegar.  Hot sauces and BBQ sauce. ? Citrus fruits, such as:  Oranges.  Lemons.  Limes. ? Tomato-based foods, such as:  Red sauce and pizza with red sauce.  Chili.  Salsa. ? Fried and fatty foods, such as:  Donuts.  [place] fries and potato chips.  High-fat dressings. ? High-fat meats, such as:  Hot dogs and sausage.  Rib eye steak.  Ham and bacon. ? High-fat dairy items, such as:  Whole milk.  Butter.  Cream cheese. ? Coffee and tea (with or without caffeine). ? Drinks that contain alcohol. ? Energy drinks and sports drinks. ? Carbonated drinks or sodas. ? Citrus fruit juices.  Eat small meals often. Avoid eating large meals.  Avoid drinking large amounts of liquid with your meals.  Avoid eating meals during the 2-3 hours before bedtime.  Avoid lying down right after you eat.  Avoid exercise for 2 hours after you eat. Lifestyle      Maintain a healthy weight. Ask your doctor what weight is healthy for you. If you need to lose weight, work with your doctor.  Exercise for at least 30 minutes on 5 or more days each week, or as told by your doctor. ? Avoid exercises that include bending forward. This can make your symptoms worse.  Wear loose  clothes. Do not wear anything tight around your waist.  Do not use any products that contain nicotine or tobacco, including cigarettes, e-cigarettes, and chewing tobacco. These can make your symptoms worse. If you need help quitting, ask your doctor.  Raise (elevate) the head of your bed about 6 inches (15 cm) when you sleep.  Try to lower your stress. If you need help doing this, ask your doctor. General instructions  Take over-the-counter and prescription medicines only as told by your doctor. ? Do not take aspirin, ibuprofen, or other NSAIDs unless your doctor says it is okay.  Pay attention to any changes in your symptoms.  Keep all follow-up visits as told by your doctor. This is important. Contact a doctor if:  You have new symptoms.  You lose weight and you do not know why it is happening.  You have trouble swallowing, or it hurts to swallow.  Your symptoms do not get better with treatment.  Your symptoms last for more than 2 days.  You have a fever.  You throw up (vomit). Get help right away if:  You have pain in your arms, neck, jaw, teeth, or back.  You feel sweaty, dizzy, or light-headed.  You pass out (faint).  You have chest pain or shortness of breath.  You cannot stop throwing up, or you throw up blood.  Your poop (stool) is bloody or black.  You have very bad pain  in your belly. These symptoms may represent a serious problem that is an emergency. Do not wait to see if the symptoms will go away. Get medical help right away. Call your local emergency services (911 in the [place] Do not drive yourself to the hospital. Summary  Indigestion is a feeling of pain, discomfort, burning, or fullness in the upper part of your belly. It tends to happen while you are eating or right after you have finished eating.  Follow an eating plan and other lifestyle changes as told by your doctor.  Take over-the-counter and prescription medicines only as told by your  doctor. Do not take aspirin, ibuprofen, or other NSAIDs unless your doctor says it is okay.  Contact your doctor if your symptoms do not get better or they get worse.  Some symptoms may represent a serious problem that is an emergency. Do not wait to see if the symptoms will go away. Get medical help right away. This information is not intended to replace advice given to you by your health care provider. Make sure you discuss any questions you have with your health care provider. Document Revised: 02/14/2018 Document Reviewed: 02/14/2018 Elsevier Patient Education  2020 [employer].

## 2020-01-16 DIAGNOSIS — G3184 Mild cognitive impairment, so stated: Secondary | ICD-10-CM | POA: Diagnosis not present

## 2020-01-16 DIAGNOSIS — R258 Other abnormal involuntary movements: Secondary | ICD-10-CM | POA: Diagnosis not present

## 2020-01-16 DIAGNOSIS — E519 Thiamine deficiency, unspecified: Secondary | ICD-10-CM | POA: Diagnosis not present

## 2020-01-16 DIAGNOSIS — E538 Deficiency of other specified B group vitamins: Secondary | ICD-10-CM | POA: Diagnosis not present

## 2020-01-16 DIAGNOSIS — R251 Tremor, unspecified: Secondary | ICD-10-CM | POA: Diagnosis not present

## 2020-01-17 ENCOUNTER — Other Ambulatory Visit: Payer: Self-pay | Admitting: Neurology

## 2020-01-17 DIAGNOSIS — G3184 Mild cognitive impairment, so stated: Secondary | ICD-10-CM

## 2020-01-20 ENCOUNTER — Other Ambulatory Visit: Payer: Self-pay | Admitting: Family Medicine

## 2020-01-20 DIAGNOSIS — R413 Other amnesia: Secondary | ICD-10-CM

## 2020-02-06 ENCOUNTER — Encounter (HOSPITAL_COMMUNITY): Payer: Self-pay

## 2020-02-06 ENCOUNTER — Ambulatory Visit (HOSPITAL_COMMUNITY)
Admission: RE | Admit: 2020-02-06 | Discharge: 2020-02-06 | Disposition: A | Discharge status: Home / Self Care | Payer: Medicare Other | Source: Ambulatory Visit | Attending: Neurology | Admitting: Neurology

## 2020-02-06 ENCOUNTER — Other Ambulatory Visit: Payer: Self-pay

## 2020-02-06 DIAGNOSIS — G3184 Mild cognitive impairment, so stated: Secondary | ICD-10-CM

## 2020-03-22 ENCOUNTER — Other Ambulatory Visit: Payer: Self-pay

## 2020-03-22 ENCOUNTER — Encounter: Payer: Self-pay | Admitting: Family Medicine

## 2020-03-22 ENCOUNTER — Ambulatory Visit (INDEPENDENT_AMBULATORY_CARE_PROVIDER_SITE_OTHER): Payer: Medicare Other | Admitting: Family Medicine

## 2020-03-22 VITALS — BP 146/71 | HR 77 | Temp 97.5°F | Resp 18 | Wt 232.0 lb

## 2020-03-22 DIAGNOSIS — R252 Cramp and spasm: Secondary | ICD-10-CM

## 2020-03-22 DIAGNOSIS — R413 Other amnesia: Secondary | ICD-10-CM | POA: Diagnosis not present

## 2020-03-22 NOTE — Progress Notes (Signed)
Established patient visit  I,April Miller,acting as a scribe for [employer], [employer].,have documented all relevant documentation on the behalf of [employer],as directed by  [employer] while in the presence of [employer], [place].   Patient: Walter [name]   DOB: 1950-10-24   69 y.o. Male  MRN: [id number] Visit Date: 03/22/2020  Today's healthcare provider: Dortha Kern, PA   Chief Complaint  Patient presents with  . 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 Has to hold the right hand steady with the left to write/draw. Still taking Vitamin-D 2000 IU qd. No tremor at rest or in the left hand. Will get routine labs and Vitamin-D blood level. Schedule neurology referral.  Patient is here due to leg and feet cramps he has been having for the past 2 months at night. Patient states his legs and feet are sore the next day after episode cramps, he is having a problem with his balance. Patient had also been having right side pain intermittently.   Patient Active Problem List   Diagnosis Date Noted  . Disease of prostate 01/13/2016  . Snores 01/13/2016  . Hyperlipidemia 01/13/2016  . Herpes zoster without complication 04/25/2010  . 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 Colon, diverticulosis 12/13/2006   Past Medical History:  Diagnosis Date  . Hyperlipidemia    Past Surgical History:  Procedure Laterality Date  . TONSILLECTOMY AND ADENOIDECTOMY  1960   Social History   Tobacco Use  . Smoking status: Never Smoker  . Smokeless tobacco: Never Used  Vaping Use  . Vaping Use: Never used  Substance Use Topics  . Alcohol use: No    Alcohol/week: 0.0 standard drinks  . Drug use: No   Family Status  Relation Name Status  . Mother  Alive  . Father  Deceased at age 47    . Sister  Alive  . Brother  Alive  . MGF  Deceased at age 15's  . PGM  Deceased  . PGF  Deceased  . Son  Alive   Allergies  Allergen Reactions  . Antihistamines, Chlorpheniramine-Type        Medications: Outpatient Medications Prior to Visit  Medication Sig  . ASHWAGANDHA PO Take 800 mg by mouth daily.   . Cinnamon 500 MG capsule Take 500 mg by mouth daily.   . Coenzyme Q10 (COQ-10) 10 MG CAPS Take 120 mg by mouth daily.   . Cyanocobalamin (VITAMIN B-12) 2500 MCG SUBL Place 3 each daily under the tongue.   . cyanocobalamin 100 MCG tablet Take 100 mcg by mouth daily.   . Glucosamine-Chondroitin (OSTEO BI-FLEX REGULAR STRENGTH PO) Take by mouth daily.   [name] MAGNESIUM PO Take 400 mg daily by mouth.   . Methylsulfonylmethane (MSM) 1000 MG CAPS Take daily by mouth.  . milk thistle 175 MG tablet Take 175 mg by mouth daily.  Gerome Sam Oleifera (MORINGA PO) Take by mouth daily.  . naproxen sodium (ALEVE) 220 MG tablet Take 220 mg daily as needed by mouth.   . NON FORMULARY daily. Natures Bounty Anxiety and Stress Relief with Ashwagandha  . OMEGA-3 FATTY ACIDS PO Take 1,200 mg daily by mouth.   . Red Yeast Rice Extract (RED YEAST RICE PO) Take 600 mg daily by mouth.   [name]  Turmeric 500 MG CAPS Take by mouth daily.   [name] UNABLE TO FIND CBD 250 mg daily  . UNABLE TO FIND daily. Red beet powder   . vitamin C (ASCORBIC ACID) 500 MG tablet Take 500 mg daily by mouth.   . Vitamin D, Cholecalciferol, 400 units TABS Take 2,000 mg by mouth daily.   . vitamin E 1000 UNIT capsule Take 180 Units by mouth daily.   . Vitamin E 180 MG CAPS Take 180 mg by mouth daily.   No facility-administered medications prior to visit.    Review of Systems  Constitutional: Negative for appetite change, chills and fever.  Respiratory: Negative for chest tightness, shortness of breath and wheezing.   Cardiovascular: Negative for chest pain and palpitations.  Gastrointestinal: Negative for abdominal pain, nausea and  vomiting.    Last CBC Lab Results  Component Value Date   WBC 5.0 12/04/2019   HGB 15.2 12/04/2019   HCT 43.4 12/04/2019   MCV 93 12/04/2019   MCH 32.5 12/04/2019   RDW 12.1 12/04/2019   PLT 208 19/62/2297   Last metabolic panel Lab Results  Component Value Date   GLUCOSE 82 12/04/2019   NA 140 12/04/2019   K 4.9 12/04/2019   CL 101 12/04/2019   CO2 26 12/04/2019   BUN 9 12/04/2019   CREATININE 0.97 12/04/2019   GFRNONAA 79 12/04/2019   GFRAA 92 12/04/2019   CALCIUM 9.7 12/04/2019   PROT 6.8 12/04/2019   ALBUMIN 4.3 12/04/2019   LABGLOB 2.5 12/04/2019   AGRATIO 1.7 12/04/2019   BILITOT 0.9 12/04/2019   ALKPHOS 88 12/04/2019   AST 21 12/04/2019   ALT 17 12/04/2019      Objective    BP (!) 146/71 (BP Location: Right Arm, Patient Position: Sitting, Cuff Size: Large)   Pulse 77   Temp (!) 97.5 F (36.4 C) (Other (Comment))   Resp 18   Wt 232 lb (105.2 kg)   SpO2 97%   BMI 31.46 kg/m    Physical Exam Vitals and nursing note reviewed.  Constitutional:      Appearance: Normal appearance. He is normal weight.  Cardiovascular:     Rate and Rhythm: Normal rate and regular rhythm.     Pulses: Normal pulses.     Heart sounds: Normal heart sounds.  Pulmonary:     Effort: Pulmonary effort is normal.     Breath sounds: Normal breath sounds.  Abdominal:     General: Bowel sounds are normal.  Musculoskeletal:        General: Normal range of motion.     Cervical back: Normal range of motion and neck supple.  Skin:    General: Skin is warm and dry.  Neurological:     Mental Status: He is alert.  Psychiatric:        Mood and Affect: Mood normal.        Behavior: Behavior normal.     No results found for any visits on 03/22/20.  Assessment & Plan     1. Muscle cramps Has notice some cramps in lower legs and toes (R worse than L) over the past couple months. Will recheck CBC, CMP and Magnesium level to assess for cause of cramps.  - CBC with  Differential/Platelet - Comprehensive metabolic panel - Magnesium  2. Memory changes Difficulty recalling MRI test that was supposed to be done in May 2021 for Dr. Manuella Ghazi (neurologist). Stumbling over words and is convinced the test was not done. Will ask  CMA to request report of results from [facility] X[facility]ray Dept. Check labs. - CBC with Differential/Platelet - Comprehensive metabolic panel    No follow-ups on file.      Haywood Pao, PA, have reviewed all documentation for this visit. The documentation on 03/22/20 for the exam, diagnosis, procedures, and orders are all accurate and complete.    Dortha Kern, PA  [facility] [phone] (phone) [phone] (fax)  [facility]

## 2020-03-22 NOTE — Patient Instructions (Signed)
Muscle Cramps and Spasms °Muscle cramps and spasms occur when a muscle or muscles tighten and you have no control over this tightening (involuntary muscle contraction). They are a common problem and can develop in any muscle. The most common place is in the calf muscles of the leg. Muscle cramps and muscle spasms are both involuntary muscle contractions, but there are some differences between the two: °· Muscle cramps are painful. They come and go and may last for a few seconds or up to 15 minutes. Muscle cramps are often more forceful and last longer than muscle spasms. °· Muscle spasms may or may not be painful. They may also last just a few seconds or much longer. °Certain medical conditions, such as diabetes or Parkinson's disease, can make it more likely to develop cramps or spasms. However, cramps or spasms are usually not caused by a serious underlying problem. Common causes include: °· Doing more physical work or exercise than your body is ready for (overexertion). °· Overuse from repeating certain movements too many times. °· Remaining in a certain position for a long period of time. °· Improper preparation, form, or technique while playing a sport or doing an activity. °· Dehydration. °· Injury. °· Side effects of some medicines. °· Abnormally low levels of the salts and minerals in your blood (electrolytes), especially potassium and calcium. This could happen if you are taking water pills (diuretics) or if you are pregnant. °In many cases, the cause of muscle cramps or spasms is not known. °Follow these instructions at home: °Managing pain and stiffness ° °  ° °· Try massaging, stretching, and relaxing the affected muscle. Do this for several minutes at a time. °· If directed, apply heat to tight or tense muscles as often as told by your health care provider. Use the heat source that your health care provider recommends, such as a moist heat pack or a heating pad. °? Place a towel between your skin and  the heat source. °? Leave the heat on for 20-30 minutes. °? Remove the heat if your skin turns bright red. This is especially important if you are unable to feel pain, heat, or cold. You may have a greater risk of getting burned. °· If directed, put ice on the affected area. This may help if you are sore or have pain after a cramp or spasm. °? Put ice in a plastic bag. °? Place a towel between your skin and the bag. °? Leave the ice on for 20 minutes, 2-3 times a day. °· Try taking hot showers or baths to help relax tight muscles. °Eating and drinking °· Drink enough fluid to keep your urine pale yellow. Staying well hydrated may help prevent cramps or spasms. °· Eat a healthy diet that includes plenty of nutrients to help your muscles function. A healthy diet includes fruits and vegetables, lean protein, whole grains, and low-fat or nonfat dairy products. °General instructions °· If you are having frequent cramps, avoid intense exercise for several days. °· Take over-the-counter and prescription medicines only as told by your health care provider. °· Pay attention to any changes in your symptoms. °· Keep all follow-up visits as told by your health care provider. This is important. °Contact a health care provider if: °· Your cramps or spasms get more severe or happen more often. °· Your cramps or spasms do not improve over time. °Summary °· Muscle cramps and spasms occur when a muscle or muscles tighten and you have no control over this   tightening (involuntary muscle contraction). °· The most common place for cramps or spasms to occur is in the calf muscles of the leg. °· Massaging, stretching, and relaxing the affected muscle may relieve the cramp or spasm. °· Drink enough fluid to keep your urine pale yellow. Staying well hydrated may help prevent cramps or spasms. °This information is not intended to replace advice given to you by your health care provider. Make sure you discuss any questions you have with your  health care provider. °Document Revised: 02/07/2018 Document Reviewed: 02/07/2018 °Elsevier Patient Education © 2020 [employer] ° °

## 2020-04-15 ENCOUNTER — Ambulatory Visit: Payer: Self-pay | Admitting: *Deleted

## 2020-04-15 NOTE — Telephone Encounter (Signed)
Patient's wife called to request appointment with PCP. Non available until August and patient's wife did not want to wait that long. Patient's wife reports patient is more confused today. Wife concerned of financial issues due to patient wanting to sale jewelry, watches, etc for money. Patient's wife reports today is the first day she has seen patient making irrational decisions. Patient's wife denies any physical c/o only memory loss issues today. Emotional support given to wife. Care advise given . Patient's wife verbalized understanding of care advise and to contact son to provide support with husband. Instructed patient to call 911 or take patient to ED if symptoms worsen and patient becomes unsafe. Wife requesting Dr. Shella Spearing to call her on her cell phone if possible: # [phone].  Reason for Disposition . [1] Worsening confusion AND [2] gradual onset (days to weeks)  Answer Assessment - Initial Assessment Questions 1. MAIN CONCERN OR SYMPTOM:  "What is your main concern right now?" "What questions do you have?" "What's the main symptom you're worried about?" (e.g., confusion, memory loss)     Wants to make an appointment due to husband's increasing memory loss for tomorrow 2. ONSET:  "When did the symptom start (or worsen)?" (minutes, hours, days, weeks)     Today  3. BETTER-SAME-WORSE: "Are you getting better, staying the same, or getting worse compared to the day you were discharged?"     worse 4. DIAGNOSIS: "Was patient diagnosed with dementia by a doctor?" If Yes, ask: "When?" (e.g., days, months, years ago)     yes 5. MEDICATION: "Has there been any change in medications recently?" (e.g., narcotics, antihistamines, benzodiazepines, etc.)     No. Wife unsure if patient is taking medication 6. OTHER SYMPTOMS: "Are there any other symptoms?" (e.g., fever, cough, pain, falling)     No  7. SUPPORT: Document living circumstances and support (e.g., family, nursing home) Lives with wife,  son lives in another [place]  Protocols used: DEMENTIA SYMPTOMS AND QUESTIONS-A-AH

## 2020-04-16 NOTE — Telephone Encounter (Signed)
Maurine Minister is out for the next few weeks. Can make an appt with Marcelino Duster, go to urgent care or see if Dr. Malvin Johns can see him sooner.

## 2020-04-16 NOTE — Telephone Encounter (Signed)
I spoke with pt's wife. Wife stated pt had improved since her first call and no longer feels he needs an appt at this time. Wife was advised that Maurine Minister is out of the office and if anything changed she could contact office to get an appt with Marcelino Duster or another provider while Maurine Minister is out of the office. Wife verbalized understanding. TNP

## 2020-05-21 DIAGNOSIS — G3184 Mild cognitive impairment, so stated: Secondary | ICD-10-CM | POA: Diagnosis not present

## 2020-05-21 DIAGNOSIS — R251 Tremor, unspecified: Secondary | ICD-10-CM | POA: Diagnosis not present

## 2020-07-08 ENCOUNTER — Other Ambulatory Visit: Payer: Self-pay

## 2020-07-08 ENCOUNTER — Ambulatory Visit (INDEPENDENT_AMBULATORY_CARE_PROVIDER_SITE_OTHER): Payer: Medicare Other | Admitting: Family Medicine

## 2020-07-08 VITALS — BP 137/79 | HR 76 | Temp 97.8°F | Wt 229.0 lb

## 2020-07-08 DIAGNOSIS — R413 Other amnesia: Secondary | ICD-10-CM | POA: Diagnosis not present

## 2020-07-08 DIAGNOSIS — R252 Cramp and spasm: Secondary | ICD-10-CM | POA: Diagnosis not present

## 2020-07-08 DIAGNOSIS — S39012A Strain of muscle, fascia and tendon of lower back, initial encounter: Secondary | ICD-10-CM

## 2020-07-08 NOTE — Progress Notes (Signed)
Acute Office Visit  Subjective:    Patient ID: Walter [name], male    DOB: 1951-01-27, 69 y.o.   MRN: [id number]  Chief Complaint  Patient presents with   Memory Loss    HPI Patient is in today for cognition issue check-up.  Son is concerned about his father driving due to memory issues.  Father does admit to forgetting things at times.   Patient states that he is still driving.  It is usually when they go out to eat and he will drive about 12 miles.    MMSE score  9/28  Past Medical History:  Diagnosis Date   Hyperlipidemia     Past Surgical History:  Procedure Laterality Date   TONSILLECTOMY AND ADENOIDECTOMY  1960    Family History  Problem Relation Age of Onset   Hyperthyroidism Mother    Lung cancer Father    Heart attack Father    Hypothyroidism Sister    Coronary artery disease Brother    Nephrolithiasis Brother    Heart attack Maternal Grandfather    Heart disease Paternal Grandmother    Emphysema Paternal Grandfather     Social History   Socioeconomic History   Marital status: Married    Spouse name: Not on file   Number of children: 1   Years of education: Not on file   Highest education level: GED or equivalent  Occupational History   Occupation: retired  Tobacco Use   Smoking status: Never Smoker   Smokeless tobacco: Never Used  Building services engineer Use: Never used  Substance and Sexual Activity   Alcohol use: No    Alcohol/week: 0.0 standard drinks   Drug use: No   Sexual activity: Not on file  Other Topics Concern   Not on file  Social History Narrative   Not on file   Social Determinants of Health   Financial Resource Strain:    Difficulty of Paying Living Expenses: Not on file  Food Insecurity:    Worried About Programme researcher, broadcasting/film/video in the Last Year: Not on file   [employer] of Food in the Last Year: Not on file  Transportation Needs:    Lack of Transportation (Medical): Not on file   Lack of  Transportation (Non-Medical): Not on file  Physical Activity:    Days of Exercise per Week: Not on file   Minutes of Exercise per Session: Not on file  Stress:    Feeling of Stress : Not on file  Social Connections:    Frequency of Communication with Friends and Family: Not on file   Frequency of Social Gatherings with Friends and Family: Not on file   Attends Religious Services: Not on file   Active Member of Clubs or Organizations: Not on file   Attends Banker Meetings: Not on file   Marital Status: Not on file  Intimate Partner Violence:    Fear of Current or Ex-Partner: Not on file   Emotionally Abused: Not on file   Physically Abused: Not on file   Sexually Abused: Not on file    Outpatient Medications Prior to Visit  Medication Sig Dispense Refill   ASHWAGANDHA PO Take 800 mg by mouth daily.      Cinnamon 500 MG capsule Take 500 mg by mouth daily.      Coenzyme Q10 (COQ-10) 10 MG CAPS Take 120 mg by mouth daily.      Cyanocobalamin (VITAMIN B-12) 2500 MCG SUBL Place 3  each daily under the tongue.      cyanocobalamin 100 MCG tablet Take 100 mcg by mouth daily.      Glucosamine-Chondroitin (OSTEO BI-FLEX REGULAR STRENGTH PO) Take by mouth daily.      MAGNESIUM PO Take 400 mg daily by mouth.      Methylsulfonylmethane (MSM) 1000 MG CAPS Take daily by mouth.     milk thistle 175 MG tablet Take 175 mg by mouth daily.     Moringa Oleifera (MORINGA PO) Take by mouth daily.     naproxen sodium (ALEVE) 220 MG tablet Take 220 mg daily as needed by mouth.      NON FORMULARY daily. Natures Bounty Anxiety and Stress Relief with Ashwagandha     OMEGA-3 FATTY ACIDS PO Take 1,200 mg daily by mouth.      Red Yeast Rice Extract (RED YEAST RICE PO) Take 600 mg daily by mouth.      Turmeric 500 MG CAPS Take by mouth daily.      UNABLE TO FIND CBD 250 mg daily     UNABLE TO FIND daily. Red beet powder      vitamin C (ASCORBIC ACID) 500 MG tablet  Take 500 mg daily by mouth.      Vitamin D, Cholecalciferol, 400 units TABS Take 2,000 mg by mouth daily.      vitamin E 1000 UNIT capsule Take 180 Units by mouth daily.      Vitamin E 180 MG CAPS Take 180 mg by mouth daily.     No facility-administered medications prior to visit.    Allergies  Allergen Reactions   Antihistamines, Chlorpheniramine-Type     Review of Systems  Psychiatric/Behavioral:       Forgetfulness       Objective:    Physical Exam Constitutional:      General: He is not in acute distress.    Appearance: He is well-developed.  HENT:     Head: Normocephalic and atraumatic.     Right Ear: Hearing and tympanic membrane normal.     Left Ear: Hearing normal.     Nose: Nose normal.     Mouth/Throat:     Mouth: Mucous membranes are moist.     Pharynx: Oropharynx is clear.  Eyes:     General: Lids are normal. No scleral icterus.       Right eye: No discharge.        Left eye: No discharge.     Conjunctiva/sclera: Conjunctivae normal.  Neck:     Vascular: No carotid bruit.  Cardiovascular:     Rate and Rhythm: Normal rate and regular rhythm.     Heart sounds: Normal heart sounds.  Pulmonary:     Effort: Pulmonary effort is normal. No respiratory distress.     Breath sounds: Normal breath sounds.  Abdominal:     General: Bowel sounds are normal.     Palpations: Abdomen is soft.  Musculoskeletal:        General: Normal range of motion.     Cervical back: Neck supple.  Skin:    Findings: No lesion or rash.  Neurological:     Mental Status: He is alert and oriented to person, place, and time.  Psychiatric:        Speech: Speech normal.     Comments: Quiet and smiling during interview. Attempted MMSE but he could not complete many tasks - score 5/30. When asked to touch his toes he just squatted or raised his right  leg. Son is with him today.     BP 137/79 (BP Location: Right Arm, Patient Position: Sitting, Cuff Size: Normal)    Pulse 76    Temp  97.8 F (36.6 C) (Oral)    Wt 229 lb (103.9 kg)    SpO2 98%    BMI 31.06 kg/m  Wt Readings from Last 3 Encounters:  07/08/20 229 lb (103.9 kg)  03/22/20 232 lb (105.2 kg)  12/28/19 236 lb 3.2 oz (107.1 kg)    Health Maintenance Due  Topic Date Due   COVID-19 Vaccine (1) Never done   INFLUENZA VACCINE  04/28/2020    There are no preventive care reminders to display for this patient.   Lab Results  Component Value Date   TSH 0.858 12/04/2019   Lab Results  Component Value Date   WBC 5.0 12/04/2019   HGB 15.2 12/04/2019   HCT 43.4 12/04/2019   MCV 93 12/04/2019   PLT 208 12/04/2019   Lab Results  Component Value Date   NA 140 12/04/2019   K 4.9 12/04/2019   CO2 26 12/04/2019   GLUCOSE 82 12/04/2019   BUN 9 12/04/2019   CREATININE 0.97 12/04/2019   BILITOT 0.9 12/04/2019   ALKPHOS 88 12/04/2019   AST 21 12/04/2019   ALT 17 12/04/2019   PROT 6.8 12/04/2019   ALBUMIN 4.3 12/04/2019   CALCIUM 9.7 12/04/2019   Lab Results  Component Value Date   CHOL 155 08/16/2017   Lab Results  Component Value Date   HDL 56 08/16/2017   Lab Results  Component Value Date   LDLCALC 87 08/16/2017   Lab Results  Component Value Date   TRIG 50 08/16/2017   Lab Results  Component Value Date   CHOLHDL 2.8 08/16/2017   No results found for: HGBA1C     Assessment & Plan:   1. Memory changes Evaluated by Dr. Malvin Johns (neurologist) on 05-21-20 for cognitive impairment and tremor in the right hand. 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 Tremor felt to be benign essential tremor. Son has recognized he is more confused and I agree he should not be driving at this point. Will check for infection or metabolic imbalances with lab tests. Advised they contact Dr. Malvin Johns and make him aware this patient is showing progression of dementia symptoms. May need referral to social worker and investigate options for placement in a  facility or assistance at home. - CBC with Differential/Platelet - Comprehensive metabolic panel  2. Muscle cramps States he has some cramps in legs at night. No spasms and moves all extremities well. - Comprehensive metabolic panel - Magnesium  3. Back strain, initial encounter Complains of a low back ache that may be associated with activities. Will check labs. - CBC with Differential/Platelet    No orders of the defined types were placed in this encounter.  Haywood Pao, PA, have reviewed all documentation for this visit. The documentation on 07/08/20 for the exam, diagnosis, procedures, and orders are all accurate and complete.   Adline Peals, CMA

## 2020-07-09 LAB — CBC WITH DIFFERENTIAL/PLATELET
Basophils Absolute: 0 10*3/uL (ref 0.0–0.2)
Basos: 1 %
EOS (ABSOLUTE): 0.2 10*3/uL (ref 0.0–0.4)
Eos: 3 %
Hematocrit: 43.1 % (ref 37.5–51.0)
Hemoglobin: 14.9 g/dL (ref 13.0–17.7)
Immature Grans (Abs): 0 10*3/uL (ref 0.0–0.1)
Immature Granulocytes: 0 %
Lymphocytes Absolute: 1.3 10*3/uL (ref 0.7–3.1)
Lymphs: 29 %
MCH: 32.3 pg (ref 26.6–33.0)
MCHC: 34.6 g/dL (ref 31.5–35.7)
MCV: 93 fL (ref 79–97)
Monocytes Absolute: 0.5 10*3/uL (ref 0.1–0.9)
Monocytes: 10 %
Neutrophils Absolute: 2.7 10*3/uL (ref 1.4–7.0)
Neutrophils: 57 %
Platelets: 195 10*3/uL (ref 150–450)
RBC: 4.62 x10E6/uL (ref 4.14–5.80)
RDW: 11.8 % (ref 11.6–15.4)
WBC: 4.7 10*3/uL (ref 3.4–10.8)

## 2020-07-09 LAB — COMPREHENSIVE METABOLIC PANEL
ALT: 14 IU/L (ref 0–44)
AST: 17 IU/L (ref 0–40)
Albumin/Globulin Ratio: 1.9 (ref 1.2–2.2)
Albumin: 4.3 g/dL (ref 3.8–4.8)
Alkaline Phosphatase: 80 IU/L (ref 44–121)
BUN/Creatinine Ratio: 10 (ref 10–24)
BUN: 10 mg/dL (ref 8–27)
Bilirubin Total: 0.7 mg/dL (ref 0.0–1.2)
CO2: 25 mmol/L (ref 20–29)
Calcium: 9.2 mg/dL (ref 8.6–10.2)
Chloride: 101 mmol/L (ref 96–106)
Creatinine, Ser: 1.01 mg/dL (ref 0.76–1.27)
GFR calc Af Amer: 87 mL/min/{1.73_m2} (ref >59)
GFR calc non Af Amer: 76 mL/min/{1.73_m2} (ref >59)
Globulin, Total: 2.3 g/dL (ref 1.5–4.5)
Glucose: 76 mg/dL (ref 65–99)
Potassium: 4.5 mmol/L (ref 3.5–5.2)
Sodium: 138 mmol/L (ref 134–144)
Total Protein: 6.6 g/dL (ref 6.0–8.5)

## 2020-07-09 LAB — MAGNESIUM: Magnesium: 2 mg/dL (ref 1.6–2.3)

## 2020-07-15 ENCOUNTER — Other Ambulatory Visit: Payer: Self-pay | Admitting: Family Medicine

## 2020-07-15 ENCOUNTER — Encounter: Payer: Self-pay | Admitting: Family Medicine

## 2020-07-15 ENCOUNTER — Telehealth: Payer: Self-pay

## 2020-07-15 NOTE — Telephone Encounter (Signed)
Patient's son reports more aggressive behavior and confusion. He had to ask his wife how to turn on the hot water in the shower, was trying to use the stove to warm up the house, complained of being cold when he was the one turing the thermostat up and down, verbally abusive to wife and she felt she had to leave the house to stay with a next door relative. Advised son this patient may need behavioral medicine evaluation and possible admission through the ER. He should advise EMS and/or the police that the patient is paranoid and aggressively demented.

## 2020-07-15 NOTE — Telephone Encounter (Signed)
Copied from CRM [id number]. Topic: Quick Communication - See Telephone Encounter >> Jul 15, 2020  8:14 AM Aretta Nip wrote: Pt Son Olu[place]iwa Kinyon called this am wanting to make sure notes from on-call went to Dr Shella Spearing this am as he called after 11:00 last nite, Pt diagnosed with dementia in April got verbally abusive with wife last nite exhibiting signs of paranoia, according to nurse that he needs to be urgently evaluated. Mother, pt wife had to leave to go to a neighbor's house last nite and son is even leery of staying over nite and pt has been left alone over nite. Son wants a cb as does not know what to do cb [phone] Rocky Link) son

## 2020-07-16 ENCOUNTER — Other Ambulatory Visit: Payer: Self-pay | Admitting: Family Medicine

## 2020-07-16 DIAGNOSIS — R413 Other amnesia: Secondary | ICD-10-CM

## 2020-07-17 ENCOUNTER — Ambulatory Visit: Payer: Self-pay | Admitting: *Deleted

## 2020-07-17 DIAGNOSIS — R413 Other amnesia: Secondary | ICD-10-CM

## 2020-07-17 DIAGNOSIS — E785 Hyperlipidemia, unspecified: Secondary | ICD-10-CM

## 2020-07-18 ENCOUNTER — Telehealth: Payer: Self-pay | Admitting: Family Medicine

## 2020-07-18 ENCOUNTER — Other Ambulatory Visit: Payer: Self-pay | Admitting: Family Medicine

## 2020-07-18 DIAGNOSIS — R413 Other amnesia: Secondary | ICD-10-CM

## 2020-07-18 NOTE — Telephone Encounter (Signed)
Sent referral to CCM Nurse.

## 2020-07-18 NOTE — Patient Instructions (Addendum)
Thank you allowing the Chronic Care Management Team to be a part of your care! It was a pleasure speaking with you today!  1. Please contact the [employer] of choice for consultation as well as private duty aids  Walter [name] son  was given information about Chronic Care Management services today including:  1. CCM service includes personalized support from designated clinical staff supervised by his physician, including individualized plan of care and coordination with other care providers 2. 24/7 contact phone numbers for assistance for urgent and routine care needs. 3. Service will only be billed when office clinical staff spend 20 minutes or more in a month to coordinate care. 4. Only one practitioner may furnish and bill the service in a calendar month. 5. The patient may stop CCM services at any time (effective at the end of the month) by phone call to the office staff. 6. The patient will be responsible for cost sharing (co-pay) of up to 20% of the service fee (after annual deductible is met).  CCM (Chronic Care Management) Team   Neldon Labella  RN, BSN Nurse Care Coordinator  [phone]  [place], LCSW Clinical Social Worker [phone]  Goals Addressed              This Visit's Progress     "My dad is mostly functional but I know heneeds more help" (pt-stated)        CARE PLAN ENTRY (see longitudinal plan of care for additional care plan information)  Current Barriers:   Level of care concerns  ADL IADL limitations  Clinical Social Work Clinical Goal(s):   Over the next 90 days, patient will follow up with private duty aid program of choice as well as chosen Psychologist, sport and exercise* as directed by SW  Interventions:  Inter-disciplinary care team collaboration (see longitudinal plan of care)  Patient's son interviewed and appropriate assessments performed  Patient's son discussed concerns that patient's behavior is progressing and he is sure that  something will need to be done soon to assure his safety  Patient's son explained need for increased supervision due to patient's increased aggression in the home  In home support explored as well as out of home placement options  Provided patient with information about placement process and estimated costs of in home care  Discussed the probability of need for legal counsel to obtain Durable POA and possibility of legal guardianship in the event that placement is needed  Collaborated with multiple in home aid agencies for availabilities  Resources provided for legal counsel and in home aid agencies and advised patient's son to contact agency of choice.  Discussed plans with patient for ongoing care management follow up and provided patient with direct contact information for care management team   Patient Self Care Activities:   Performs ADL's independently  Unable to perform IADLs independently  Initial goal documentation          The patient verbalized understanding of instructions provided today and declined a print copy of patient instruction materials.   Telephone follow up appointment with care management team member scheduled for:  07/19/20

## 2020-07-18 NOTE — Telephone Encounter (Signed)
Hi Walter [name] There was a referral place for referral for social work but should be placed under chronic case management. Can you fix this ?,Thanks

## 2020-07-18 NOTE — Chronic Care Management (AMB) (Signed)
.ccms  Chronic Care Management    Clinical Social Work General Note  07/18/2020 Name: Walter [name] MRN: [id number] DOB: June 13, 1951  Walter [name] is a 69 y.o. year old male who is a primary care patient of Chrismon, Vickki Muff, [place]. The CCM was consulted to assist the patient with Level of Care Concerns.   Walter [name] son  was given information about Chronic Care Management services today including:  1. CCM service includes personalized support from designated clinical staff supervised by his physician, including individualized plan of care and coordination with other care providers 2. 24/7 contact phone numbers for assistance for urgent and routine care needs. 3. Service will only be billed when office clinical staff spend 20 minutes or more in a month to coordinate care. 4. Only one practitioner may furnish and bill the service in a calendar month. 5. The patient may stop CCM services at any time (effective at the end of the month) by phone call to the office staff. 6. The patient will be responsible for cost sharing (co-pay) of up to 20% of the service fee (after annual deductible is met).    Review of patient status, including review of consultants reports, relevant laboratory and other test results, and collaboration with appropriate care team members and the patient's provider was performed as part of comprehensive patient evaluation and provision of chronic care management services.    SDOH (Social Determinants of Health) assessments and interventions performed:  No    Outpatient Encounter Medications as of 07/17/2020  Medication Sig Note  . ASHWAGANDHA PO Take 800 mg by mouth daily.    . Cinnamon 500 MG capsule Take 500 mg by mouth daily.    . Coenzyme Q10 (COQ-10) 10 MG CAPS Take 120 mg by mouth daily.    . Cyanocobalamin (VITAMIN B-12) 2500 MCG SUBL Place 3 each daily under the tongue.    . cyanocobalamin 100 MCG tablet Take 100 mcg by mouth daily.    .  Glucosamine-Chondroitin (OSTEO BI-FLEX REGULAR STRENGTH PO) Take by mouth daily.    [name] MAGNESIUM PO Take 400 mg daily by mouth.    . Methylsulfonylmethane (MSM) 1000 MG CAPS Take daily by mouth.   . milk thistle 175 MG tablet Take 175 mg by mouth daily.   Edyth Gunnels Oleifera (MORINGA PO) Take by mouth daily.   . naproxen sodium (ALEVE) 220 MG tablet Take 220 mg daily as needed by mouth.  08/13/2017: PRN   . NON FORMULARY daily. Natures Bounty Anxiety and Stress Relief with Ashwagandha   . OMEGA-3 FATTY ACIDS PO Take 1,200 mg daily by mouth.    . Red Yeast Rice Extract (RED YEAST RICE PO) Take 600 mg daily by mouth.    . Turmeric 500 MG CAPS Take by mouth daily.    [name] UNABLE TO FIND CBD 250 mg daily   . UNABLE TO FIND daily. Red beet powder    . vitamin C (ASCORBIC ACID) 500 MG tablet Take 500 mg daily by mouth.    . Vitamin D, Cholecalciferol, 400 units TABS Take 2,000 mg by mouth daily.    . vitamin E 1000 UNIT capsule Take 180 Units by mouth daily.    . Vitamin E 180 MG CAPS Take 180 mg by mouth daily.    No facility-administered encounter medications on file as of 07/17/2020.    Goals Addressed              This Visit's Progress   .  "  My dad is mostly functional but I know heneeds more help" (pt-stated)        CARE PLAN ENTRY (see longitudinal plan of care for additional care plan information)  Current Barriers:  . Level of care concerns . ADL IADL limitations  Clinical Social Work Clinical Goal(s):  [name] Over the next 90 days, patient will follow up with private duty aid program of choice as well as chosen legal representative* as directed by SW  Interventions: . Inter-disciplinary care team collaboration (see longitudinal plan of care) . Patient's son interviewed and appropriate assessments performed . Patient's son discussed concerns that patient's behavior is progressing and he is sure that something will need to be done soon to assure his safety . Patient's son explained  need for increased supervision due to patient's increased aggression in the home . In home support explored as well as out of home placement options . Provided patient with information about placement process and estimated costs of in home care . Discussed the probability of need for legal counsel to obtain Durable POA and possibility of legal guardianship in the event that placement is needed . Collaborated with multiple in home aid agencies for availabilities . Resources provided for legal counsel and in home aid agencies and advised patient's son to contact agency of choice. . Discussed plans with patient for ongoing care management follow up and provided patient with direct contact information for care management team   Patient Self Care Activities:  . Performs ADL's independently . Unable to perform IADLs independently  Initial goal documentation          Follow Up Plan: SW will follow up with patient's son by phone over the next 7-14 days        Walter [name], [place] Worker  [place] [facility] [phone]

## 2020-07-18 NOTE — Chronic Care Management (AMB) (Signed)
  Chronic Care Management   Social Work Note  07/18/2020 Name: Walter [name] MRN: [id number] DOB: [birth year]  Walter [name] is a 69 y.o. year old male who sees Chrismon, Jodell Cipro, [place] for primary care. The CCM team was consulted for assistance with Level of Care Concerns.   Phone call to patient. Consent obtained to speak to his son on his behalf regarding CCM services.  SDOH (Social Determinants of Health) assessments performed: No     Outpatient Encounter Medications as of 07/17/2020  Medication Sig Note  . ASHWAGANDHA PO Take 800 mg by mouth daily.    . Cinnamon 500 MG capsule Take 500 mg by mouth daily.    . Coenzyme Q10 (COQ-10) 10 MG CAPS Take 120 mg by mouth daily.    . Cyanocobalamin (VITAMIN B-12) 2500 MCG SUBL Place 3 each daily under the tongue.    . cyanocobalamin 100 MCG tablet Take 100 mcg by mouth daily.    . Glucosamine-Chondroitin (OSTEO BI-FLEX REGULAR STRENGTH PO) Take by mouth daily.    [name] MAGNESIUM PO Take 400 mg daily by mouth.    . Methylsulfonylmethane (MSM) 1000 MG CAPS Take daily by mouth.   . milk thistle 175 MG tablet Take 175 mg by mouth daily.   Gerome Sam Oleifera (MORINGA PO) Take by mouth daily.   . naproxen sodium (ALEVE) 220 MG tablet Take 220 mg daily as needed by mouth.  08/13/2017: PRN   . NON FORMULARY daily. Natures Bounty Anxiety and Stress Relief with Ashwagandha   . OMEGA-3 FATTY ACIDS PO Take 1,200 mg daily by mouth.    . Red Yeast Rice Extract (RED YEAST RICE PO) Take 600 mg daily by mouth.    . Turmeric 500 MG CAPS Take by mouth daily.    [name] UNABLE TO FIND CBD 250 mg daily   . UNABLE TO FIND daily. Red beet powder    . vitamin C (ASCORBIC ACID) 500 MG tablet Take 500 mg daily by mouth.    . Vitamin D, Cholecalciferol, 400 units TABS Take 2,000 mg by mouth daily.    . vitamin E 1000 UNIT capsule Take 180 Units by mouth daily.    . Vitamin E 180 MG CAPS Take 180 mg by mouth daily.    No facility-administered encounter medications on file  as of 07/17/2020.    Goals Addressed   None     Follow Up Plan: This Child psychotherapist will contact patient's son to discuss CCM services   Mistey Hoffert, [place] Clinical Social Worker  [facility] Care Management [phone]

## 2020-07-19 ENCOUNTER — Telehealth: Payer: Self-pay | Admitting: *Deleted

## 2020-07-19 DIAGNOSIS — G3184 Mild cognitive impairment, so stated: Secondary | ICD-10-CM | POA: Diagnosis not present

## 2020-07-19 DIAGNOSIS — G250 Essential tremor: Secondary | ICD-10-CM | POA: Diagnosis not present

## 2020-07-19 NOTE — Telephone Encounter (Signed)
   Chronic Care Management   Unsuccessful Call Note 07/19/2020 Name: Walter [name] MRN: [id number] DOB: 07/27/1951  Patient  is a 69 year old male who sees Dortha Kern, [place] for primary care. Dortha Kern, PA asked the CCM team to consult the patient for Level of Care Concerns.     This social worker was unable to reach patient's son  via telephone today for follow up call regarding resources provided. I have left HIPAA compliant voicemail asking patient to return my call. (unsuccessful outreach #1).   Plan: Will follow-up within 5-7  business days via telephone.      Verna Czech, LCSW Clinical Social Worker  [facility] Care Management [phone]

## 2020-07-23 ENCOUNTER — Encounter: Payer: Self-pay | Admitting: Family Medicine

## 2020-07-23 ENCOUNTER — Ambulatory Visit: Payer: Self-pay | Admitting: *Deleted

## 2020-07-23 ENCOUNTER — Telehealth: Payer: Self-pay | Admitting: Family Medicine

## 2020-07-23 DIAGNOSIS — E785 Hyperlipidemia, unspecified: Secondary | ICD-10-CM

## 2020-07-23 DIAGNOSIS — R413 Other amnesia: Secondary | ICD-10-CM

## 2020-07-23 NOTE — Telephone Encounter (Signed)
He probably needs FMLA forms filled out by his PCP (son is [place]named, [place].). Will print a letter he can use to help employer know what is happening.

## 2020-07-23 NOTE — Telephone Encounter (Signed)
Pt's Son, Walter [name] [phone]) Requesting a note for his work stating he has been taking Iantha Fallen and Caryn Section to their recent appointments.  Please call Rocky Link back to let him know when note is ready for pick up.  Thanks, [employer]

## 2020-07-24 NOTE — Patient Instructions (Signed)
Thank you allowing the Chronic Care Management Team to be a part of your care! It was a pleasure speaking with you today!  1. Please follow up with the [employer] on Thursday for consultation 2. Please contact in home care agency of choice to discuss in home care needs  CCM (Chronic Care Management) Team   Juanell Fairly RN, BSN Nurse Care Coordinator  [phone]  [place] [address] LCSW Clinical Social Worker [phone]  Goals Addressed              This Visit's Progress   .  "My dad is mostly functional but I know heneeds more help" (pt-stated)        CARE PLAN ENTRY (see longitudinal plan of care for additional care plan information)  Current Barriers:  . Level of care concerns . ADL IADL limitations  Clinical Social Work Clinical Goal(s):  [name] Over the next 90 days, patient will follow up with private duty aid program of choice as well as chosen legal representative* as directed by SW  Interventions: . Inter-disciplinary care team collaboration (see longitudinal plan of care) . Follow up phone call from patient's son . Patient's son confirmed that he has an appointment with the [employer] next Thursday to work on [employer] and guardianship for patient in the event placement in a facility is needed . Patient's son further discussed plan to contact one of the in home care agencies to coordinate in home care for patient as his condition progresses . Patient's son provided with positive reinforcement for efforts made to coordinate patient's care . Discussed plans with patient for ongoing care management follow up and provided patient with direct contact information for care management team   Patient Self Care Activities:  . Performs ADL's independently . Unable to perform IADLs independently  Please see past updates related to this goal by clicking on the "Past Updates" button in the selected goal           The patient verbalized understanding of  instructions provided today and declined a print copy of patient instruction materials.   Telephone follow up appointment with care management team member scheduled for: 08/02/20

## 2020-07-24 NOTE — Telephone Encounter (Signed)
Walter [name], I checked on this and we as the patient's PCP have to fill out the [facility] paperwork.  I will let him know that you have done the letter and then if he needs to do FMLA he will need to get [place] those forms.

## 2020-07-24 NOTE — Chronic Care Management (AMB) (Signed)
Chronic Care Management    Clinical Social Work Follow Up Note  07/24/2020 Name: Walter [name] MRN: [id number] DOB: 07-Jun-1951  Walter [name] is a 69 y.o. year old male who is a primary care patient of Chrismon, Jodell Cipro, [place]. The CCM team was consulted for assistance with Level of Care Concerns.   Review of patient status, including review of consultants reports, other relevant assessments, and collaboration with appropriate care team members and the patient's provider was performed as part of comprehensive patient evaluation and provision of chronic care management services.    SDOH (Social Determinants of Health) assessments performed: No    Outpatient Encounter Medications as of 07/23/2020  Medication Sig Note  . ASHWAGANDHA PO Take 800 mg by mouth daily.    . Cinnamon 500 MG capsule Take 500 mg by mouth daily.    . Coenzyme Q10 (COQ-10) 10 MG CAPS Take 120 mg by mouth daily.    . Cyanocobalamin (VITAMIN B-12) 2500 MCG SUBL Place 3 each daily under the tongue.    . cyanocobalamin 100 MCG tablet Take 100 mcg by mouth daily.    . Glucosamine-Chondroitin (OSTEO BI-FLEX REGULAR STRENGTH PO) Take by mouth daily.    [name] MAGNESIUM PO Take 400 mg daily by mouth.    . Methylsulfonylmethane (MSM) 1000 MG CAPS Take daily by mouth.   . milk thistle 175 MG tablet Take 175 mg by mouth daily.   Gerome Sam Oleifera (MORINGA PO) Take by mouth daily.   . naproxen sodium (ALEVE) 220 MG tablet Take 220 mg daily as needed by mouth.  08/13/2017: PRN   . NON FORMULARY daily. Natures Bounty Anxiety and Stress Relief with Ashwagandha   . OMEGA-3 FATTY ACIDS PO Take 1,200 mg daily by mouth.    . Red Yeast Rice Extract (RED YEAST RICE PO) Take 600 mg daily by mouth.    . Turmeric 500 MG CAPS Take by mouth daily.    [name] UNABLE TO FIND CBD 250 mg daily   . UNABLE TO FIND daily. Red beet powder    . vitamin C (ASCORBIC ACID) 500 MG tablet Take 500 mg daily by mouth.    . Vitamin D, Cholecalciferol, 400 units  TABS Take 2,000 mg by mouth daily.    . vitamin E 1000 UNIT capsule Take 180 Units by mouth daily.    . Vitamin E 180 MG CAPS Take 180 mg by mouth daily.    No facility-administered encounter medications on file as of 07/23/2020.     Goals Addressed              This Visit's Progress   .  "My dad is mostly functional but I know heneeds more help" (pt-stated)        CARE PLAN ENTRY (see longitudinal plan of care for additional care plan information)  Current Barriers:  . Level of care concerns . ADL IADL limitations  Clinical Social Work Clinical Goal(s):  [name] Over the next 90 days, patient will follow up with private duty aid program of choice as well as chosen legal representative* as directed by SW  Interventions: . Inter-disciplinary care team collaboration (see longitudinal plan of care) . Follow up phone call from patient's son . Patient's son confirmed that he has an appointment with the [employer] next Thursday to work on [employer] and guardianship for patient in the event placement in a facility is needed . Patient's son further discussed plan to contact one of the in  home care agencies to coordinate in home care for patient as his condition progresses . Patient's son provided with positive reinforcement for efforts made to coordinate patient's care . Discussed plans with patient for ongoing care management follow up and provided patient with direct contact information for care management team   Patient Self Care Activities:  . Performs ADL's independently . Unable to perform IADLs independently  Please see past updates related to this goal by clicking on the "Past Updates" button in the selected goal           Follow Up Plan: SW will follow up with patient by phone over the next 7-14 business days    [place], [place] Clinical Social Worker  [facility] Care Management [phone]

## 2020-07-25 ENCOUNTER — Encounter: Payer: Self-pay | Admitting: Family Medicine

## 2020-07-25 NOTE — Telephone Encounter (Signed)
Let me know when the letter is ready and I will call him at that time. Thanks

## 2020-07-25 NOTE — Telephone Encounter (Signed)
Will print another letter regarding need for son to help with father' care.

## 2020-07-25 NOTE — Telephone Encounter (Signed)
LMTCB Letter up front, need to talk with Walter [name] about FMLA forms

## 2020-07-25 NOTE — Telephone Encounter (Signed)
Letter corrected and he probably needs an FMLA form.

## 2020-07-30 ENCOUNTER — Ambulatory Visit: Payer: Self-pay | Admitting: *Deleted

## 2020-07-30 DIAGNOSIS — E785 Hyperlipidemia, unspecified: Secondary | ICD-10-CM

## 2020-07-30 DIAGNOSIS — R413 Other amnesia: Secondary | ICD-10-CM

## 2020-07-31 ENCOUNTER — Ambulatory Visit: Payer: Self-pay | Admitting: *Deleted

## 2020-07-31 DIAGNOSIS — R413 Other amnesia: Secondary | ICD-10-CM

## 2020-07-31 DIAGNOSIS — E785 Hyperlipidemia, unspecified: Secondary | ICD-10-CM

## 2020-07-31 NOTE — Patient Instructions (Signed)
Thank you allowing the Chronic Care Management Team to be a part of your care! It was a pleasure speaking with you today!  1. Please call this Child psychotherapist with any questions or concerns regarding patient's ling term needs  CCM (Chronic Care Management) Team   Juanell Fairly RN, BSN Nurse Care Coordinator  [phone]  Mavryk Pino, LCSW Clinical Social Worker [phone]  Goals Addressed              This Visit's Progress     "My dad is mostly functional but I know he needs more help" (pt-stated)        CARE PLAN ENTRY (see longitudinal plan of care for additional care plan information)  Current Barriers:   Level of care concerns  ADL IADL limitations  Clinical Social Work Clinical Goal(s):   Over the next 90 days, patient will follow up with private duty aid program of choice as well as chosen Journalist, newspaper* as directed by SW  Interventions:  Patient's son confirmed that he had the appointment with the [employer] and he will be meeting with them on Monday to sign papers for Medical and Financial POA   Patient's son further discussed being in contact with [facility] for patient and he has an evaluation set up with them tomorrow at 12:30pm  Patient's son informed this Child psychotherapist that his mother has been discharged from the ED and will follow up with ortho. She also uses a walker  Patient's son will be returning home and has put the following in place for care. [facility] will be providing in home care, he has purchased a life alert system for them both, his uncle and additional church members will assist with transportation to medical appointments  He has arranged insta cart for grocery deliveries as well as [employer] delivery  Positive reinforcement provided to patient's son for his follow up in arranging patient's ongoing care  Discussed plans with patient for ongoing care management follow up and provided patient with direct  contact information for care management team   Patient Self Care Activities:   Performs ADL's independently  Unable to perform IADLs independently  Please see past updates related to this goal by clicking on the "Past Updates" button in the selected goal           The patient verbalized understanding of instructions provided today and declined a print copy of patient instruction materials.   Telephone follow up appointment with care management team member scheduled for: 09/03/20

## 2020-07-31 NOTE — Progress Notes (Signed)
This encounter was created in error - please disregard.

## 2020-07-31 NOTE — Patient Instructions (Signed)
Thank you allowing the Chronic Care Management Team to be a part of your care! It was a pleasure speaking with you today!  1. Please follow up with the [employer] as well as [facility] in home care regarding follow up  CCM (Chronic Care Management) Team   Juanell Fairly RN, BSN Nurse Care Coordinator  [phone]  Jaylani Mcguinn [address] LCSW Clinical Social Worker [phone]  Goals Addressed              This Visit's Progress   .  "My dad is mostly functional but I know heneeds more help" (pt-stated)        CARE PLAN ENTRY (see longitudinal plan of care for additional care plan information)  Current Barriers:  . Level of care concerns . ADL IADL limitations  Clinical Social Work Clinical Goal(s):  [name] Over the next 90 days, patient will follow up with private duty aid program of choice as well as chosen legal representative* as directed by SW  Interventions: . Patient's son confirmed that he had the appointment with the [employer] and he will be meeting wit them this week to sign papers for Durable POA  . Patient's son further discussed being in contact with [place] [facility] for patient and he has an evaluation set up with them as well to establish in home care . Patient's son informed this social worker that his mother is in the ED and he has concerns that patient will not be able to provide care. This Child psychotherapist suggested that he discuss his concerns with the discharge planners at the hospital to discuss possible options for care. . Discussed plans with patient for ongoing care management follow up and provided patient with direct contact information for care management team   Patient Self Care Activities:  . Performs ADL's independently . Unable to perform IADLs independently  Please see past updates related to this goal by clicking on the "Past Updates" button in the selected goal           The patient verbalized understanding of instructions provided  today and agreed to receive a mailed copy of patient instruction and/or educational materials.  Telephone follow up appointment with care management team member scheduled for:  07/31/20

## 2020-07-31 NOTE — Addendum Note (Signed)
Addended by: Wenda Overland on: 07/31/2020 06:31 PM   Modules accepted: Level of Service, SmartSet

## 2020-07-31 NOTE — Chronic Care Management (AMB) (Signed)
Chronic Care Management    Clinical Social Work Follow Up Note  07/31/2020 Name: Walter [name] MRN: [id number] DOB: [birth year]  Walter [name] is a 69 y.o. year old male who is a primary care patient of Chrismon, Jodell Cipro, [place]. The CCM team was consulted for assistance with [employer] .   Review of patient status, including review of consultants reports, other relevant assessments, and collaboration with appropriate care team members and the patient's provider was performed as part of comprehensive patient evaluation and provision of chronic care management services.    SDOH (Social Determinants of Health) assessments performed: No    Outpatient Encounter Medications as of 07/31/2020  Medication Sig Note  . ASHWAGANDHA PO Take 800 mg by mouth daily.    . Cinnamon 500 MG capsule Take 500 mg by mouth daily.    . Coenzyme Q10 (COQ-10) 10 MG CAPS Take 120 mg by mouth daily.    . Cyanocobalamin (VITAMIN B-12) 2500 MCG SUBL Place 3 each daily under the tongue.    . cyanocobalamin 100 MCG tablet Take 100 mcg by mouth daily.    . Glucosamine-Chondroitin (OSTEO BI-FLEX REGULAR STRENGTH PO) Take by mouth daily.    [name] MAGNESIUM PO Take 400 mg daily by mouth.    . Methylsulfonylmethane (MSM) 1000 MG CAPS Take daily by mouth.   . milk thistle 175 MG tablet Take 175 mg by mouth daily.   Gerome Sam Oleifera (MORINGA PO) Take by mouth daily.   . naproxen sodium (ALEVE) 220 MG tablet Take 220 mg daily as needed by mouth.  08/13/2017: PRN   . NON FORMULARY daily. Natures Bounty Anxiety and Stress Relief with Ashwagandha   . OMEGA-3 FATTY ACIDS PO Take 1,200 mg daily by mouth.    . Red Yeast Rice Extract (RED YEAST RICE PO) Take 600 mg daily by mouth.    . Turmeric 500 MG CAPS Take by mouth daily.    [name] UNABLE TO FIND CBD 250 mg daily   . UNABLE TO FIND daily. Red beet powder    . vitamin C (ASCORBIC ACID) 500 MG tablet Take 500 mg daily by mouth.    . Vitamin D, Cholecalciferol, 400 units TABS  Take 2,000 mg by mouth daily.    . vitamin E 1000 UNIT capsule Take 180 Units by mouth daily.    . Vitamin E 180 MG CAPS Take 180 mg by mouth daily.    No facility-administered encounter medications on file as of 07/31/2020.     Goals Addressed              This Visit's Progress   .  "My dad is mostly functional but I know he needs more help" (pt-stated)        CARE PLAN ENTRY (see longitudinal plan of care for additional care plan information)  Current Barriers:  . Level of care concerns . ADL IADL limitations  Clinical Social Work Clinical Goal(s):  [name] Over the next 90 days, patient will follow up with private duty aid program of choice as well as chosen legal representative* as directed by SW  Interventions: . Patient's son confirmed that he had the appointment with the [employer] and he will be meeting with them on Monday to sign papers for Medical and Financial POA  . Patient's son further discussed being in contact with [facility] for patient and he has an evaluation set up with them tomorrow at 12:30pm . Patient's son informed this social  worker that his mother has been discharged from the ED and will follow up with ortho. She also uses a walker . Patient's son will be returning home and has put the following in place for care. [facility] will be providing in home care, he has purchased a life alert system for them both, his uncle and additional church members will assist with transportation to medical appointments . He has arranged insta cart for grocery deliveries as well as [facility] delivery . Positive reinforcement provided to patient's son for his follow up in arranging patient's ongoing care . Discussed plans with patient for ongoing care management follow up and provided patient with direct contact information for care management team   Patient Self Care Activities:  . Performs ADL's independently . Unable to perform IADLs independently  Please  see past updates related to this goal by clicking on the "Past Updates" button in the selected goal           Follow Up Plan: SW will follow up with patient's son  by phone over the next 30 days    Reylene [place]aud[place], [place] Clinical Social Worker  [facility] Care Management [phone]

## 2020-07-31 NOTE — Patient Instructions (Signed)
Thank you allowing the Chronic Care Management Team to be a part of your care! It was a pleasure speaking with you today!  1. Please call this social worker with any additional questions or concerns regarding patient's level of care needs  CCM (Chronic Care Management) Team   Juanell Fairly RN, BSN Nurse Care Coordinator  [phone]  Sherion Dooly, LCSW Clinical Social Worker [phone]  Goals Addressed              This Visit's Progress     "My dad is mostly functional but I know he needs more help" (pt-stated)        CARE PLAN ENTRY (see longitudinal plan of care for additional care plan information)  Current Barriers:   Level of care concerns  ADL IADL limitations  Clinical Social Work Clinical Goal(s):   Over the next 90 days, patient will follow up with private duty aid program of choice as well as chosen Journalist, newspaper* as directed by SW  Interventions:  Patient's son confirmed that he had the appointment with the [employer] and he will be meeting with them on Monday to sign papers for Medical and Financial POA .  Patient's son further discussed being in contact with [facility] for patient and he has an evaluation set up with them tomorrow at 12:30pm  Patient's son informed this social worker that his mother has been discharged from the ED and will follow up with ortho. She also uses a walker  Patient's son will be returning home and has put the following in place for care. [facility] will be providing in home care, he has purchased a life alert system for them both, his uncle and additional church members will assist with transportation to medical appointments  He has arranged insta cart for grocery deliveries as well as [employer] delivery  Positive reinforcement provided to patient's son for his follow up in arranging patient's ongoing care  Discussed plans with patient for ongoing care management follow up and provided  patient with direct contact information for care management team   Patient Self Care Activities:   Performs ADL's independently  Unable to perform IADLs independently  Please see past updates related to this goal by clicking on the "Past Updates" button in the selected goal           The patient verbalized understanding of instructions provided today and declined a print copy of patient instruction materials.   A HIPAA compliant phone message was left for the patient providing contact information and requesting a return call.  Telephone follow up appointment with care management team member scheduled for: 09/03/20

## 2020-07-31 NOTE — Chronic Care Management (AMB) (Signed)
Chronic Care Management    Clinical Social Work Follow Up Note  07/31/2020 Name: RYLON POITRA MRN: [id number] DOB: [birth year]  JERIAN MORAIS is a 69 y.o. year old male who is a primary care patient of Chrismon, Jodell Cipro, [place]. The CCM team was consulted for assistance with [employer] .   Review of patient status, including review of consultants reports, other relevant assessments, and collaboration with appropriate care team members and the patient's provider was performed as part of comprehensive patient evaluation and provision of chronic care management services.    SDOH (Social Determinants of Health) assessments performed: No    Outpatient Encounter Medications as of 07/30/2020  Medication Sig Note  . ASHWAGANDHA PO Take 800 mg by mouth daily.    . Cinnamon 500 MG capsule Take 500 mg by mouth daily.    . Coenzyme Q10 (COQ-10) 10 MG CAPS Take 120 mg by mouth daily.    . Cyanocobalamin (VITAMIN B-12) 2500 MCG SUBL Place 3 each daily under the tongue.    . cyanocobalamin 100 MCG tablet Take 100 mcg by mouth daily.    . Glucosamine-Chondroitin (OSTEO BI-FLEX REGULAR STRENGTH PO) Take by mouth daily.    [name] MAGNESIUM PO Take 400 mg daily by mouth.    . Methylsulfonylmethane (MSM) 1000 MG CAPS Take daily by mouth.   . milk thistle 175 MG tablet Take 175 mg by mouth daily.   Gerome Sam Oleifera (MORINGA PO) Take by mouth daily.   . naproxen sodium (ALEVE) 220 MG tablet Take 220 mg daily as needed by mouth.  08/13/2017: PRN   . NON FORMULARY daily. Natures Bounty Anxiety and Stress Relief with Ashwagandha   . OMEGA-3 FATTY ACIDS PO Take 1,200 mg daily by mouth.    . Red Yeast Rice Extract (RED YEAST RICE PO) Take 600 mg daily by mouth.    . Turmeric 500 MG CAPS Take by mouth daily.    [name] UNABLE TO FIND CBD 250 mg daily   . UNABLE TO FIND daily. Red beet powder    . vitamin C (ASCORBIC ACID) 500 MG tablet Take 500 mg daily by mouth.    . Vitamin D, Cholecalciferol, 400 units TABS  Take 2,000 mg by mouth daily.    . vitamin E 1000 UNIT capsule Take 180 Units by mouth daily.    . Vitamin E 180 MG CAPS Take 180 mg by mouth daily.    No facility-administered encounter medications on file as of 07/30/2020.     Goals Addressed              This Visit's Progress   .  "My dad is mostly functional but I know heneeds more help" (pt-stated)        CARE PLAN ENTRY (see longitudinal plan of care for additional care plan information)  Current Barriers:  . Level of care concerns . ADL IADL limitations  Clinical Social Work Clinical Goal(s):  [name] Over the next 90 days, patient will follow up with private duty aid program of choice as well as chosen legal representative* as directed by SW  Interventions: . Patient's son confirmed that he had the appointment with the [employer] and he will be meeting wit them this week to sign papers for Durable POA  . Patient's son further discussed being in contact with [facility] for patient and he has an evaluation set up with them as well to establish in home care . Patient's son informed this  social worker that his mother is in the ED and he has concerns that patient will not be able to provide care. This Child psychotherapist suggested that he discuss his concerns with the discharge planners at the hospital to discuss possible options for care. . Discussed plans with patient for ongoing care management follow up and provided patient with direct contact information for care management team   Patient Self Care Activities:  . Performs ADL's independently . Unable to perform IADLs independently  Please see past updates related to this goal by clicking on the "Past Updates" button in the selected goal           Follow Up Plan: SW will follow up with patient by phone over the next 7-10 business days    [place], [place] Clinical Social Worker  [facility] Care Management [phone]

## 2020-07-31 NOTE — Chronic Care Management (AMB) (Signed)
Chronic Care Management    Clinical Social Work Follow Up Note  07/31/2020 Name: TORRIN CRIHFIELD MRN: [id number] DOB: Apr 19, 1951  ANTONIO CRESWELL is a 69 y.o. year old male who is a primary care patient of Chrismon, Jodell Cipro, [place]. The CCM team was consulted for assistance with Level of Care Concerns.   Review of patient status, including review of consultants reports, other relevant assessments, and collaboration with appropriate care team members and the patient's provider was performed as part of comprehensive patient evaluation and provision of chronic care management services.    SDOH (Social Determinants of Health) assessments performed: No    Outpatient Encounter Medications as of 07/31/2020  Medication Sig Note  . ASHWAGANDHA PO Take 800 mg by mouth daily.    . Cinnamon 500 MG capsule Take 500 mg by mouth daily.    . Coenzyme Q10 (COQ-10) 10 MG CAPS Take 120 mg by mouth daily.    . Cyanocobalamin (VITAMIN B-12) 2500 MCG SUBL Place 3 each daily under the tongue.    . cyanocobalamin 100 MCG tablet Take 100 mcg by mouth daily.    . Glucosamine-Chondroitin (OSTEO BI-FLEX REGULAR STRENGTH PO) Take by mouth daily.    [name] MAGNESIUM PO Take 400 mg daily by mouth.    . Methylsulfonylmethane (MSM) 1000 MG CAPS Take daily by mouth.   . milk thistle 175 MG tablet Take 175 mg by mouth daily.   Gerome Sam Oleifera (MORINGA PO) Take by mouth daily.   . naproxen sodium (ALEVE) 220 MG tablet Take 220 mg daily as needed by mouth.  08/13/2017: PRN   . NON FORMULARY daily. Natures Bounty Anxiety and Stress Relief with Ashwagandha   . OMEGA-3 FATTY ACIDS PO Take 1,200 mg daily by mouth.    . Red Yeast Rice Extract (RED YEAST RICE PO) Take 600 mg daily by mouth.    . Turmeric 500 MG CAPS Take by mouth daily.    [name] UNABLE TO FIND CBD 250 mg daily   . UNABLE TO FIND daily. Red beet powder    . vitamin C (ASCORBIC ACID) 500 MG tablet Take 500 mg daily by mouth.    . Vitamin D, Cholecalciferol, 400 units TABS  Take 2,000 mg by mouth daily.    . vitamin E 1000 UNIT capsule Take 180 Units by mouth daily.    . Vitamin E 180 MG CAPS Take 180 mg by mouth daily.    No facility-administered encounter medications on file as of 07/31/2020.     Goals Addressed              This Visit's Progress   .  "My dad is mostly functional but I know he needs more help" (pt-stated)        CARE PLAN ENTRY (see longitudinal plan of care for additional care plan information)  Current Barriers:  . Level of care concerns . ADL IADL limitations  Clinical Social Work Clinical Goal(s):  [name] Over the next 90 days, patient will follow up with private duty aid program of choice as well as chosen legal representative* as directed by SW  Interventions: . Patient's son confirmed that he had the appointment with the [employer] and he will be meeting with them on Monday to sign papers for Medical and Financial POA . [name] Patient's son further discussed being in contact with [facility] home care for patient and he has an evaluation set up with them tomorrow at 12:30pm . Patient's son informed this  social worker that his mother has been discharged from the ED and will follow up with ortho. She also uses a walker . Patient's son will be returning home and has put the following in place for care. [facility] will be providing in home care, he has purchased a life alert system for them both, his uncle and additional church members will assist with transportation to medical appointments . He has arranged insta cart for grocery deliveries as well as [facility] delivery . Positive reinforcement provided to patient's son for his follow up in arranging patient's ongoing care . Discussed plans with patient for ongoing care management follow up and provided patient with direct contact information for care management team   Patient Self Care Activities:  . Performs ADL's independently . Unable to perform IADLs independently  Please  see past updates related to this goal by clicking on the "Past Updates" button in the selected goal           Follow Up Plan: SW will follow up with patient's son by phone over the next 30 business days   [place], [place] Clinical Social Worker  [facility] Care Management [phone]

## 2020-08-01 ENCOUNTER — Telehealth: Payer: Self-pay

## 2020-09-03 ENCOUNTER — Telehealth: Payer: Self-pay | Admitting: *Deleted

## 2020-09-03 ENCOUNTER — Telehealth: Payer: Self-pay

## 2020-09-03 NOTE — Telephone Encounter (Signed)
   Chronic Care Management   Unsuccessful Call Note 09/03/2020 Name: Walter [name] MRN: [id number] DOB: 10/20/1950  [name] is a 69 year old male who sees Dortha Kern, [place]  for primary care. Dortha Kern, PA asked the CCM team to consult the patient for [employer]. [employer] and believed to be provided, today's call was to patient's son to follow up on the status and to assess for any further resource needs   This social worker was unable to reach patient via telephone today for follow up call. I have left HIPAA compliant voicemail asking patient to return my call. (unsuccessful outreach #1).   Plan: Will follow-up within 7-10  business days via telephone.      Verna Czech, LCSW Clinical Social Worker  [facility] Care Management [phone]

## 2020-09-05 NOTE — Progress Notes (Addendum)
Subjective:   Walter [name] is a 69 y.o. male who presents for Medicare Annual/Subsequent preventive examination.   I connected with Walter [name] today by telephone and verified that I am speaking with the correct person using two identifiers. Location patient: home Location provider: work Persons participating in the virtual visit: patient, provider.   I discussed the limitations, risks, security and privacy concerns of performing an evaluation and management service by telephone and the availability of in person appointments. I also discussed with the patient that there may be a patient responsible charge related to this service. The patient expressed understanding and verbally consented to this telephonic visit.    Interactive audio and video telecommunications were attempted between this provider and patient, however failed, due to patient having technical difficulties OR patient did not have access to video capability.  We continued and completed visit with audio only.   Review of Systems    N/A  Cardiac Risk Factors include: advanced age (>76men, >2 women);male gender;obesity (BMI >30kg/m2)     Objective:    There were no vitals filed for this visit. There is no height or weight on file to calculate BMI.  Advanced Directives 09/09/2020 09/05/2019 08/19/2018 08/12/2017 08/13/2016  Does Patient Have a Medical Advance Directive? Yes Yes Yes Yes No  Type of Estate agent of [place];Living will Healthcare Power of [place];Living will Healthcare Power of [place];Living will Healthcare Power of [place];Living will -  Copy of Healthcare Power of Attorney in Chart? Yes - validated most recent copy scanned in chart (See row information) No - copy requested No - copy requested No - copy requested -  Would patient like information on creating a medical advance directive? - - - - No - patient declined information    Current Medications (verified) Outpatient  Encounter Medications as of 09/09/2020  Medication Sig   ASHWAGANDHA PO Take 800 mg by mouth daily.    Biotin 1 MG CAPS Take 10,000 mcg by mouth daily.   bismuth subsalicylate (PEPTO BISMOL) 262 MG chewable tablet Chew 524 mg by mouth as needed.   Coenzyme Q10 (COQ-10) 10 MG CAPS Take 120 mg by mouth daily.    Cyanocobalamin (VITAMIN B-12) 5000 MCG LOZG Take by mouth daily.   MAGNESIUM PO Take 400 mg daily by mouth.    milk thistle 175 MG tablet Take 175 mg by mouth daily.   Moringa Oleifera (MORINGA PO) Take by mouth daily.   naproxen sodium (ALEVE) 220 MG tablet Take 220 mg daily as needed by mouth.    OMEGA-3 FATTY ACIDS PO Take 1,200 mg daily by mouth.    Red Yeast Rice Extract (RED YEAST RICE PO) Take 600 mg daily by mouth.    rivastigmine (EXELON) 1.5 MG capsule Take 1.5 mg by mouth 2 (two) times daily.   Turmeric 500 MG CAPS Take 1,500 mg by mouth daily.   UNABLE TO FIND daily. Red beet powder   vitamin C (ASCORBIC ACID) 500 MG tablet Take 500 mg daily by mouth.    Vitamin D, Cholecalciferol, 400 units TABS Take 2,000 mg by mouth daily.    vitamin E 1000 UNIT capsule Take 180 Units by mouth daily.    zinc gluconate 50 MG tablet Take 50 mg by mouth daily.   Cinnamon 500 MG capsule Take 500 mg by mouth daily.  (Patient not taking: Reported on 09/09/2020)   Cyanocobalamin (VITAMIN B-12) 2500 MCG SUBL Place 3 each daily under the tongue.  (Patient not taking:  Reported on 09/09/2020)   cyanocobalamin 100 MCG tablet Take 100 mcg by mouth daily.  (Patient not taking: Reported on 09/09/2020)   Glucosamine-Chondroitin (OSTEO BI-FLEX REGULAR STRENGTH PO) Take by mouth daily.  (Patient not taking: Reported on 09/09/2020)   Methylsulfonylmethane (MSM) 1000 MG CAPS Take daily by mouth. (Patient not taking: Reported on 09/09/2020)   NON FORMULARY daily. Natures Bounty Anxiety and Stress Relief with Ashwagandha (Patient not taking: Reported on 09/09/2020)   UNABLE TO FIND CBD 250 mg daily (Patient  not taking: Reported on 09/09/2020)   Vitamin E 180 MG CAPS Take 180 mg by mouth daily. (Patient not taking: Reported on 09/09/2020)   No facility-administered encounter medications on file as of 09/09/2020.    Allergies (verified) Antihistamines, chlorpheniramine-type   History: Past Medical History:  Diagnosis Date   Hyperlipidemia    Past Surgical History:  Procedure Laterality Date   TONSILLECTOMY AND ADENOIDECTOMY  1960   Family History  Problem Relation Age of Onset   Hyperthyroidism Mother    Lung cancer Father    Heart attack Father    Hypothyroidism Sister    Coronary artery disease Brother    Nephrolithiasis Brother    Heart attack Maternal Grandfather    Heart disease Paternal Grandmother    Emphysema Paternal Grandfather    Social History   Socioeconomic History   Marital status: Married    Spouse name: Not on file   Number of children: 1   Years of education: Not on file   Highest education level: GED or equivalent  Occupational History   Occupation: retired  Tobacco Use   Smoking status: Never Smoker   Smokeless tobacco: Never Used  Building services engineerVaping Use   Vaping Use: Never used  Substance and Sexual Activity   Alcohol use: No    Alcohol/week: 0.0 standard drinks   Drug use: No   Sexual activity: Not on file  Other Topics Concern   Not on file  Social History Narrative   Not on file   Social Determinants of Health   Financial Resource Strain: Low Risk    Difficulty of Paying Living Expenses: Not hard at all  Food Insecurity: No Food Insecurity   Worried About Programme researcher, broadcasting/film/videounning Out of Food in the Last Year: Never true   Ran Out of Food in the Last Year: Never true  Transportation Needs: No Transportation Needs   Lack of Transportation (Medical): No   Lack of Transportation (Non-Medical): No  Physical Activity: Inactive   Days of Exercise per Week: 0 days   Minutes of Exercise per Session: 0 min  Stress: No Stress Concern Present   Feeling of Stress : Not  at all  Social Connections: Socially Isolated   Frequency of Communication with Friends and Family: Once a week   Frequency of Social Gatherings with Friends and Family: Never   Attends Religious Services: Never   Diplomatic Services operational officerActive Member of Clubs or Organizations: No   Attends Engineer, structuralClub or Organization Meetings: Never   Marital Status: Married    Tobacco Counseling Counseling given: Not Answered   Clinical Intake:  Pre-visit preparation completed: Yes  Pain : No/denies pain     Nutritional Risks: None Diabetes: No  How often do you need to have someone help you when you read instructions, pamphlets, or other written materials from your doctor or pharmacy?: 1 - Never  Diabetic? No  Interpreter Needed?: No  Information entered by :: Mmarkoski, LPN   Activities of Daily Living In your present state of  health, do you have any difficulty performing the following activities: 09/09/2020  Hearing? Y  Comment Does not wear hearing aids.  Vision? Y  Difficulty concentrating or making decisions? Y  Comment Has dementia.  Walking or climbing stairs? N  Dressing or bathing? N  Doing errands, shopping? Y  Comment Does not drive.  Preparing Food and eating ? N  Using the Toilet? N  In the past six months, have you accidently leaked urine? N  Do you have problems with loss of bowel control? N  Managing your Medications? Y  Comment Wife manages medications.  Managing your Finances? Y  Comment Son manages finances.  Housekeeping or managing your Housekeeping? N  Some recent data might be hidden    Patient Care Team: Chrismon, Jodell Cipro, PA-C as PCP - General (Family Medicine) Wenda [place], [place] as Social Worker Lonell Face, MD as Consulting Physician (Neurology) Harlin Rain Wandra Arthurs (Neurology)  Indicate any recent Medical Services you may have received from other than [facility] providers in the past year (date may be approximate).     Assessment:   This is a routine wellness  examination for [place].  Hearing/Vision screen No exam data present  Dietary issues and exercise activities discussed: Current Exercise Habits: The patient does not participate in regular exercise at present, Exercise limited by: Other - see comments (Has dementia)  Goals       Patient Stated     "My dad is mostly functional but I know he needs more help" (pt-stated)      CARE PLAN ENTRY (see longitudinal plan of care for additional care plan information)  Current Barriers:  Level of care concerns ADL IADL limitations  Clinical Social Work Clinical Goal(s):  Over the next 90 days, patient will follow up with private duty aid program of choice as well as chosen Journalist, newspaper* as directed by SW  Interventions: Patient's son confirmed that he had the appointment with the [employer] and he will be meeting with them on Monday to sign papers for Medical and Financial POA . Patient's son further discussed being in contact with [facility] home care for patient and he has an evaluation set up with them tomorrow at 12:30pm Patient's son informed this social worker that his mother has been discharged from the ED and will follow up with ortho. She also uses a walker Patient's son will be returning home and has put the following in place for care. [facility] will be providing in home care, he has purchased a life alert system for them both, his uncle and additional church members will assist with transportation to medical appointments He has arranged insta cart for grocery deliveries as well as [employer] delivery Positive reinforcement provided to patient's son for his follow up in arranging patient's ongoing care Discussed plans with patient for ongoing care management follow up and provided patient with direct contact information for care management team   Patient Self Care Activities:  Performs ADL's independently Unable to perform IADLs independently  Please see past  updates related to this goal by clicking on the "Past Updates" button in the selected goal         Other     Eat more fruits and vegetables      Recommend eating 2 servings of fruits and vegetables daily.       Increase water intake      Starting 08/13/16, I will increase my water intake to 5-6 glasses a day.  Depression Screen PHQ 2/9 Scores 09/09/2020 09/05/2019 08/19/2018 08/19/2018 08/12/2017 08/13/2016  PHQ - 2 Score 0 1 0 0 0 0  PHQ- 9 Score - - 0 - - -    Fall Risk Fall Risk  09/09/2020 09/05/2019 08/19/2018 08/12/2017 08/13/2016  Falls in the past year? 0 0 0 No No  Number falls in past yr: 0 0 - - -  Injury with Fall? 0 0 - - -    FALL RISK PREVENTION PERTAINING TO THE HOME:  Any stairs in or around the home? Yes  If so, are there any without handrails? Yes  Home free of loose throw rugs in walkways, pet beds, electrical cords, etc? Yes  Adequate lighting in your home to reduce risk of falls? Yes   ASSISTIVE DEVICES UTILIZED TO PREVENT FALLS:  Life alert? Yes  Use of a cane, walker or w/c? No  Grab bars in the bathroom? Yes  Shower chair or bench in shower? No  Elevated toilet seat or a handicapped toilet? No    Cognitive Function: MMSE - Mini Mental State Exam 07/08/2020 12/04/2019 08/28/2019  Not completed: - (No Data) -  Orientation to time 0 2 4  Orientation to Place 5 4 3   Registration 1 2 3   Attention/ Calculation 0 1 3  Recall 0 0 1  Language- name 2 objects 2 2 2   Language- repeat 0 1 0.5  Language- follow 3 step command 1 0 3  Language- read & follow direction 0 0 0.5  Write a sentence (No Data) 0 1  Write a sentence-comments not done - -  Copy design (No Data) 0 1  Copy design-comments not done - -  Total score - 12 22     6CIT Screen 08/19/2018 08/13/2016  What Year? 0 points 0 points  What month? 0 points 0 points  What time? 0 points 0 points  Count back from 20 0 points 0 points  Months in reverse 4 points 0 points  Repeat  phrase 4 points 4 points  Total Score 8 4    Immunizations Immunization History  Administered Date(s) Administered   Fluad Quad(high Dose 65+) 06/06/2019   Influenza Split 05/22/2013   Influenza, High Dose Seasonal PF 08/15/2015, 06/25/2016, 07/08/2017, 06/25/2018   Influenza,inj,Quad PF,6+ Mos 06/29/2014   PFIZER SARS-COV-2 Vaccination 11/20/2019, 12/18/2019   Pneumococcal Conjugate-13 08/15/2015   Pneumococcal Polysaccharide-23 07/08/2017   Tetanus 05/01/2016    TDAP status: Up to date  Flu Vaccine status: Per wife pt states he has received this vaccine but advised her there is nothing on file regarding him receiving this at our office. Wife to take pt to the pharmacy to receive this.   Pneumococcal vaccine status: Up to date  Covid-19 vaccine status: Completed vaccines  Qualifies for Shingles Vaccine? Yes   Zostavax completed No   Shingrix Completed?: No.    Education has been provided regarding the importance of this vaccine. Patient has been advised to call insurance company to determine out of pocket expense if they have not yet received this vaccine. Advised may also receive vaccine at local pharmacy or Health Dept. Verbalized acceptance and understanding.  Screening Tests Health Maintenance  Topic Date Due   INFLUENZA VACCINE  04/28/2020   COVID-19 Vaccine (3 - Booster for Pfizer series) 06/19/2020   COLONOSCOPY  12/19/2023   TETANUS/TDAP  05/01/2026   Hepatitis C Screening  Completed   PNA vac Low Risk Adult  Completed    Health Maintenance  Health Maintenance  Due  Topic Date Due   INFLUENZA VACCINE  04/28/2020   COVID-19 Vaccine (3 - Booster for Pfizer series) 06/19/2020    Colorectal cancer screening: Type of screening: Colonoscopy. Completed 12/18/13. Repeat every 10 years  Lung Cancer Screening: (Low Dose CT Chest recommended if Age 63-80 years, 30 pack-year currently smoking OR have quit w/in 15years.) does not qualify.   Additional  Screening:  Hepatitis C Screening: Up to date  Vision Screening: Recommended annual ophthalmology exams for early detection of glaucoma and other disorders of the eye. Is the patient up to date with their annual eye exam? No, wife to call and schedule apt. Who is the provider or what is the name of the office in which the patient attends annual eye exams? N/A If pt is not established with a provider, would they like to be referred to a provider to establish care? No .   Dental Screening: Recommended annual dental exams for proper oral hygiene  Community Resource Referral / Chronic Care Management: CRR required this visit?  Yes, for transportation assistance.  CCM required this visit?  No      Plan:     I have personally reviewed and noted the following in the patient's chart:   Medical and social history Use of alcohol, tobacco or illicit drugs  Current medications and supplements Functional ability and status Nutritional status Physical activity Advanced directives List of other physicians Hospitalizations, surgeries, and ER visits in previous 12 months Vitals Screenings to include cognitive, depression, and falls Referrals and appointments  In addition, I have reviewed and discussed with patient certain preventive protocols, quality metrics, and best practice recommendations. A written personalized care plan for preventive services as well as general preventive health recommendations were provided to patient.     Mariadelaluz [place], [place]   [id number]   Nurse Notes: Wife to check with local pharmacy about receiving the flu vaccine and Covid booster.   Reviewed the note and plan of Nurse Health Advisor. Was available for consultation. Agree with documentation and recommendations.

## 2020-09-09 ENCOUNTER — Ambulatory Visit (INDEPENDENT_AMBULATORY_CARE_PROVIDER_SITE_OTHER): Payer: Medicare Other

## 2020-09-09 ENCOUNTER — Other Ambulatory Visit: Payer: Self-pay

## 2020-09-09 DIAGNOSIS — Z0000 Encounter for general adult medical examination without abnormal findings: Secondary | ICD-10-CM | POA: Diagnosis not present

## 2020-09-09 NOTE — Patient Instructions (Signed)
Walter [name] , Thank you for taking time to come for your Medicare Wellness Visit. I appreciate your ongoing commitment to your health goals. Please review the following plan we discussed and let me know if I can assist you in the future.   Screening recommendations/referrals: Colonoscopy: Up to date, due 11/2023 Recommended yearly ophthalmology/optometry visit for glaucoma screening and checkup Recommended yearly dental visit for hygiene and checkup  Vaccinations: Influenza vaccine: Currently due, wife to take him to the pharmacy to receive.  Pneumococcal vaccine: Completed series Tdap vaccine: Currently due, declined receiving. Shingles vaccine: Shingrix discussed. Please contact your pharmacy for coverage information.     Advanced directives: Currently on file.  Conditions/risks identified: Recommend increasing water intake to 6-8 8 oz glasses a day and also recommend to increase fruit and vegetable intake to 2 servings each a day.  Next appointment: 09/24/20 @ 10:00 AM for a CCM call   Preventive Care 65 Years and Older, Male Preventive care refers to lifestyle choices and visits with your health care provider that can promote health and wellness. What does preventive care include?  A yearly physical exam. This is also called an annual well check.  Dental exams once or twice a year.  Routine eye exams. Ask your health care provider how often you should have your eyes checked.  Personal lifestyle choices, including:  Daily care of your teeth and gums.  Regular physical activity.  Eating a healthy diet.  Avoiding tobacco and drug use.  Limiting alcohol use.  Practicing safe sex.  Taking low doses of aspirin every day.  Taking vitamin and mineral supplements as recommended by your health care provider. What happens during an annual well check? The services and screenings done by your health care provider during your annual well check will depend on your age, overall  health, lifestyle risk factors, and family history of disease. Counseling  Your health care provider may ask you questions about your:  Alcohol use.  Tobacco use.  Drug use.  Emotional well-being.  Home and relationship well-being.  Sexual activity.  Eating habits.  History of falls.  Memory and ability to understand (cognition).  Work and work Astronomer. Screening  You may have the following tests or measurements:  Height, weight, and BMI.  Blood pressure.  Lipid and cholesterol levels. These may be checked every 5 years, or more frequently if you are over 32 years old.  Skin check.  Lung cancer screening. You may have this screening every year starting at age 59 if you have a 30-pack-year history of smoking and currently smoke or have quit within the past 15 years.  Fecal occult blood test (FOBT) of the stool. You may have this test every year starting at age 46.  Flexible sigmoidoscopy or colonoscopy. You may have a sigmoidoscopy every 5 years or a colonoscopy every 10 years starting at age 70.  Prostate cancer screening. Recommendations will vary depending on your family history and other risks.  Hepatitis C blood test.  Hepatitis B blood test.  Sexually transmitted disease (STD) testing.  Diabetes screening. This is done by checking your blood sugar (glucose) after you have not eaten for a while (fasting). You may have this done every 1-3 years.  Abdominal aortic aneurysm (AAA) screening. You may need this if you are a current or former smoker.  Osteoporosis. You may be screened starting at age 84 if you are at high risk. Talk with your health care provider about your test results, treatment options, and  if necessary, the need for more tests. Vaccines  Your health care provider may recommend certain vaccines, such as:  Influenza vaccine. This is recommended every year.  Tetanus, diphtheria, and acellular pertussis (Tdap, Td) vaccine. You may need a Td  booster every 10 years.  Zoster vaccine. You may need this after age 41.  Pneumococcal 13-valent conjugate (PCV13) vaccine. One dose is recommended after age 85.  Pneumococcal polysaccharide (PPSV23) vaccine. One dose is recommended after age 60. Talk to your health care provider about which screenings and vaccines you need and how often you need them. This information is not intended to replace advice given to you by your health care provider. Make sure you discuss any questions you have with your health care provider. Document Released: 10/11/2015 Document Revised: 06/03/2016 Document Reviewed: 07/16/2015 Elsevier Interactive Patient Education  2017 [employer].  Fall Prevention in the Home Falls can cause injuries. They can happen to people of all ages. There are many things you can do to make your home safe and to help prevent falls. What can I do on the outside of my home?  Regularly fix the edges of walkways and driveways and fix any cracks.  Remove anything that might make you trip as you walk through a door, such as a raised step or threshold.  Trim any bushes or trees on the path to your home.  Use bright outdoor lighting.  Clear any walking paths of anything that might make someone trip, such as rocks or tools.  Regularly check to see if handrails are loose or broken. Make sure that both sides of any steps have handrails.  Any raised decks and porches should have guardrails on the edges.  Have any leaves, snow, or ice cleared regularly.  Use sand or salt on walking paths during winter.  Clean up any spills in your garage right away. This includes oil or grease spills. What can I do in the bathroom?  Use night lights.  Install grab bars by the toilet and in the tub and shower. Do not use towel bars as grab bars.  Use non-skid mats or decals in the tub or shower.  If you need to sit down in the shower, use a plastic, non-slip stool.  Keep the floor dry. Clean up  any water that spills on the floor as soon as it happens.  Remove soap buildup in the tub or shower regularly.  Attach bath mats securely with double-sided non-slip rug tape.  Do not have throw rugs and other things on the floor that can make you trip. What can I do in the bedroom?  Use night lights.  Make sure that you have a light by your bed that is easy to reach.  Do not use any sheets or blankets that are too big for your bed. They should not hang down onto the floor.  Have a firm chair that has side arms. You can use this for support while you get dressed.  Do not have throw rugs and other things on the floor that can make you trip. What can I do in the kitchen?  Clean up any spills right away.  Avoid walking on wet floors.  Keep items that you use a lot in easy-to-reach places.  If you need to reach something above you, use a strong step stool that has a grab bar.  Keep electrical cords out of the way.  Do not use floor polish or wax that makes floors slippery. If you  must use wax, use non-skid floor wax.  Do not have throw rugs and other things on the floor that can make you trip. What can I do with my stairs?  Do not leave any items on the stairs.  Make sure that there are handrails on both sides of the stairs and use them. Fix handrails that are broken or loose. Make sure that handrails are as long as the stairways.  Check any carpeting to make sure that it is firmly attached to the stairs. Fix any carpet that is loose or worn.  Avoid having throw rugs at the top or bottom of the stairs. If you do have throw rugs, attach them to the floor with carpet tape.  Make sure that you have a light switch at the top of the stairs and the bottom of the stairs. If you do not have them, ask someone to add them for you. What else can I do to help prevent falls?  Wear shoes that:  Do not have high heels.  Have rubber bottoms.  Are comfortable and fit you well.  Are  closed at the toe. Do not wear sandals.  If you use a stepladder:  Make sure that it is fully opened. Do not climb a closed stepladder.  Make sure that both sides of the stepladder are locked into place.  Ask someone to hold it for you, if possible.  Clearly mark and make sure that you can see:  Any grab bars or handrails.  First and last steps.  Where the edge of each step is.  Use tools that help you move around (mobility aids) if they are needed. These include:  Canes.  Walkers.  Scooters.  Crutches.  Turn on the lights when you go into a dark area. Replace any light bulbs as soon as they burn out.  Set up your furniture so you have a clear path. Avoid moving your furniture around.  If any of your floors are uneven, fix them.  If there are any pets around you, be aware of where they are.  Review your medicines with your doctor. Some medicines can make you feel dizzy. This can increase your chance of falling. Ask your doctor what other things that you can do to help prevent falls. This information is not intended to replace advice given to you by your health care provider. Make sure you discuss any questions you have with your health care provider. Document Released: 07/11/2009 Document Revised: 02/20/2016 Document Reviewed: 10/19/2014 Elsevier Interactive Patient Education  2017 [employer].

## 2020-09-13 ENCOUNTER — Telehealth: Payer: Self-pay | Admitting: Family Medicine

## 2020-09-13 NOTE — Telephone Encounter (Signed)
Referral Reason: Transportation Needs   Interventions: Successful outbound call placed to the patient Patient's wife [name] stated that they have aquired assistance for transportaiton through family members. No additional assistance needed at this time.   Follow up plan: No further follow up planned at this time. The patient has been provided with needed resources.

## 2020-09-16 ENCOUNTER — Telehealth: Payer: Self-pay | Admitting: Family Medicine

## 2020-09-16 NOTE — Telephone Encounter (Signed)
She will have to call her local postmaster to relocate a mailbox on her property that is accessible to the mail carrier from their vehicle.

## 2020-09-16 NOTE — Telephone Encounter (Signed)
Patient Wife, Walter [name] is calling to see if Walter [name] can write a letter to the [employer] to see if the mailbox can be moved in front of their home. Patient's wife states that the current mailbox at a top of a dangerous hill. The speed limit is on the hill. And the patient is hard to hear. Please advise when the letter is ready. CB- [phone]

## 2020-09-17 NOTE — Telephone Encounter (Signed)
Patient's wife Arline Asp advised as below. She states she called the [place] post office and was told that they couldn't move it because there has to be mailboxes on both sides of the road. The only thing [place] post office offered was to give patient a P.O box. Arline Asp states that neither her or her husband drives so that wouldn't be very helpful to them. Arline Asp states she is going to try contacting one of the main post office branches to see if they can help with getting patient's mail box moved.

## 2020-09-24 ENCOUNTER — Encounter: Payer: Self-pay | Admitting: *Deleted

## 2020-09-24 ENCOUNTER — Ambulatory Visit: Payer: Self-pay | Admitting: *Deleted

## 2020-09-24 DIAGNOSIS — R413 Other amnesia: Secondary | ICD-10-CM

## 2020-09-24 DIAGNOSIS — E785 Hyperlipidemia, unspecified: Secondary | ICD-10-CM

## 2020-09-24 NOTE — Patient Instructions (Signed)
Thank you allowing the Chronic Care Management Team to be a part of your care! It was a pleasure speaking with you today!  1. Please call this social worker with any additional community resource needs  CCM (Chronic Care Management) Team   Juanell Fairly RN, BSN Nurse Care Coordinator  [phone]  Johnathon Mittal [address] LCSW Clinical Social Worker [phone]  Goals Addressed              This Visit's Progress   .  "My dad is mostly functional but I know he needs more help" (pt-stated)        CARE PLAN ENTRY (see longitudinal plan of care for additional care plan information)  Current Barriers:  . Level of care concerns . ADL IADL limitations  Clinical Social Work Clinical Goal(s):  [name] Over the next 90 days, patient will follow up with private duty aid program of choice as well as chosen legal representative* as directed by SW  Interventions: . Patient's son confirmed that the appointment with the [employer] went well and he no has Medical and Financial POA . [name] Patient's son further discussed being in contact with [facility] home care for patient, evaluation set up however was cancelled . Patient's son confirms that with medication adherence, he sees no need for in home assistance at this time . Patient's son has now returned home and has put the following in place for care. [facility] is now on hold for now, he has purchased a life alert system for them both, his uncle and additional church members will assist with transportation to medical appointments, he has Medical and Financial POA . No additional community resource needs verbalized at this time. Patient's son to call this social worker back with any additional resource needs . Discussed plans with patient for ongoing care management follow up and provided patient with direct contact information for care management team   Patient Self Care Activities:  . Performs ADL's independently . Unable to perform IADLs  independently  Please see past updates related to this goal by clicking on the "Past Updates" button in the selected goal           The patient verbalized understanding of instructions, educational materials, and care plan provided today and declined offer to receive copy of patient instructions, educational materials, and care plan.   No further follow up required: patient's son to call this Child psychotherapist with any additional community resource needs

## 2020-09-24 NOTE — Chronic Care Management (AMB) (Signed)
Chronic Care Management    Clinical Social Work Follow Up Note  09/24/2020 Name: Walter [name] MRN: [id number] DOB: [birth year]  Walter [name] is a 69 y.o. year old male who is a primary care patient of Chrismon, Jodell Cipro, PA-C. The CCM team was consulted for assistance with [facility] .   Review of patient status, including review of consultants reports, other relevant assessments, and collaboration with appropriate care team members and the patient's provider was performed as part of comprehensive patient evaluation and provision of chronic care management services.    SDOH (Social Determinants of Health) assessments performed: No    Outpatient Encounter Medications as of 09/24/2020  Medication Sig Note  . ASHWAGANDHA PO Take 800 mg by mouth daily.    . Biotin 1 MG CAPS Take 10,000 mcg by mouth daily.   Walter [name] bismuth subsalicylate (PEPTO BISMOL) 262 MG chewable tablet Chew 524 mg by mouth as needed.   . Cinnamon 500 MG capsule Take 500 mg by mouth daily.  (Patient not taking: Reported on 09/09/2020)   . Coenzyme Q10 (COQ-10) 10 MG CAPS Take 120 mg by mouth daily.    . Cyanocobalamin (VITAMIN B-12) 2500 MCG SUBL Place 3 each daily under the tongue.  (Patient not taking: Reported on 09/09/2020)   . Cyanocobalamin (VITAMIN B-12) 5000 MCG LOZG Take by mouth daily.   . cyanocobalamin 100 MCG tablet Take 100 mcg by mouth daily.  (Patient not taking: Reported on 09/09/2020)   . Glucosamine-Chondroitin (OSTEO BI-FLEX REGULAR STRENGTH PO) Take by mouth daily.  (Patient not taking: Reported on 09/09/2020)   . MAGNESIUM PO Take 400 mg daily by mouth.    . Methylsulfonylmethane (MSM) 1000 MG CAPS Take daily by mouth. (Patient not taking: Reported on 09/09/2020)   . milk thistle 175 MG tablet Take 175 mg by mouth daily.   Walter [name] (MORINGA PO) Take by mouth daily.   . naproxen sodium (ALEVE) 220 MG tablet Take 220 mg daily as needed by mouth.  08/13/2017: PRN   . NON FORMULARY  daily. Natures Bounty Anxiety and Stress Relief with Ashwagandha (Patient not taking: Reported on 09/09/2020)   . OMEGA-3 FATTY ACIDS PO Take 1,200 mg daily by mouth.    . Red Yeast Rice Extract (RED YEAST RICE PO) Take 600 mg daily by mouth.    . rivastigmine (EXELON) 1.5 MG capsule Take 1.5 mg by mouth 2 (two) times daily.   . Turmeric 500 MG CAPS Take 1,500 mg by mouth daily.   Walter [name] UNABLE TO FIND CBD 250 mg daily (Patient not taking: Reported on 09/09/2020)   . UNABLE TO FIND daily. Red beet powder   . vitamin C (ASCORBIC ACID) 500 MG tablet Take 500 mg daily by mouth.    . Vitamin D, Cholecalciferol, 400 units TABS Take 2,000 mg by mouth daily.    . vitamin E 1000 UNIT capsule Take 180 Units by mouth daily.    . Vitamin E 180 MG CAPS Take 180 mg by mouth daily. (Patient not taking: Reported on 09/09/2020)   . zinc gluconate 50 MG tablet Take 50 mg by mouth daily.    No facility-administered encounter medications on file as of 09/24/2020.     Goals Addressed              This Visit's Progress   .  "My dad is mostly functional but I know he needs more help" (pt-stated)        CARE PLAN ENTRY (  see longitudinal plan of care for additional care plan information)  Current Barriers:  . Level of care concerns . ADL IADL limitations  Clinical Social Work Clinical Goal(s):  Walter [name] Over the next 90 days, patient will follow up with private duty aid program of choice as well as chosen legal representative* as directed by SW  Interventions: . Patient's son confirmed that the appointment with the [employer] went well and he no has Medical and Financial POA . Walter [name] Patient's son further discussed being in contact with [facility] home care for patient, evaluation set up however was cancelled . Patient's son confirms that with medication adherence, he sees no need for in home assistance at this time . Patient's son has now returned home and has put the following in place for care. [facility] is now on hold for now, he has purchased a life alert system for them both, his uncle and additional church members will assist with transportation to medical appointments, he has Medical and Financial POA . No additional community resource needs verbalized at this time. Patient's son to call this social worker back with any additional resource needs . Discussed plans with patient for ongoing care management follow up and provided patient with direct contact information for care management team   Patient Self Care Activities:  . Performs ADL's independently . Unable to perform IADLs independently  Please see past updates related to this goal by clicking on the "Past Updates" button in the selected goal           Follow Up Plan: Client's son to call this Child psychotherapist with any community resource needs     [place], LCSW Clinical Social Worker  [facility] Care Management [phone]

## 2020-09-24 NOTE — Telephone Encounter (Signed)
This encounter was created in error - please disregard.

## 2020-10-23 DIAGNOSIS — R251 Tremor, unspecified: Secondary | ICD-10-CM | POA: Diagnosis not present

## 2020-10-23 DIAGNOSIS — G3184 Mild cognitive impairment, so stated: Secondary | ICD-10-CM | POA: Diagnosis not present

## 2020-10-23 DIAGNOSIS — R258 Other abnormal involuntary movements: Secondary | ICD-10-CM | POA: Diagnosis not present

## 2020-11-12 DIAGNOSIS — R251 Tremor, unspecified: Secondary | ICD-10-CM | POA: Diagnosis not present

## 2020-11-12 DIAGNOSIS — R262 Difficulty in walking, not elsewhere classified: Secondary | ICD-10-CM | POA: Diagnosis not present

## 2020-11-13 DIAGNOSIS — R262 Difficulty in walking, not elsewhere classified: Secondary | ICD-10-CM | POA: Diagnosis not present

## 2020-11-13 DIAGNOSIS — R251 Tremor, unspecified: Secondary | ICD-10-CM | POA: Diagnosis not present

## 2020-11-22 DIAGNOSIS — R262 Difficulty in walking, not elsewhere classified: Secondary | ICD-10-CM | POA: Diagnosis not present

## 2020-11-22 DIAGNOSIS — R251 Tremor, unspecified: Secondary | ICD-10-CM | POA: Diagnosis not present

## 2020-11-25 ENCOUNTER — Telehealth: Payer: Self-pay | Admitting: Family Medicine

## 2020-11-25 NOTE — Telephone Encounter (Addendum)
Pt wife [name] is calling and dr Clelia Croft neurologist  recommends her husband to have physical therapy and speech therapy. Pt was dx with dementia and parkinson disease. Pt wife is aware dennis not in the office . Pt will do physical therapy and Speech therapy if dennis would concur with dr Clelia Croft recommendation

## 2020-11-25 NOTE — Telephone Encounter (Signed)
Please advise 

## 2020-11-27 NOTE — Telephone Encounter (Signed)
Agree 

## 2020-11-27 NOTE — Telephone Encounter (Signed)
Patient's wife wants to hold off on physical therapy and speech therapy that was ordered by Dr. Sherryll Burger. She reports that the patient is doing well. She reports that he stays pretty active, and there has not been any change in his parkinson's disease since last year.

## 2021-01-15 DIAGNOSIS — G3184 Mild cognitive impairment, so stated: Secondary | ICD-10-CM | POA: Diagnosis not present

## 2021-03-24 ENCOUNTER — Encounter: Payer: Self-pay | Admitting: *Deleted

## 2021-03-24 ENCOUNTER — Ambulatory Visit: Payer: Self-pay | Admitting: *Deleted

## 2021-03-24 DIAGNOSIS — R413 Other amnesia: Secondary | ICD-10-CM

## 2021-03-24 DIAGNOSIS — E785 Hyperlipidemia, unspecified: Secondary | ICD-10-CM

## 2021-03-24 NOTE — Chronic Care Management (AMB) (Signed)
   03/24/2021  JOHNMARK GEIGER 06-16-1951 [id number]   Phone call received from patient's son requesting a return phone call to discuss placement process and possible options. Message left requesting a date and time that the above issues can be discussed. This Child psychotherapist will await a return call.   Verna Czech, LCSW Clinical Social Worker  [facility] Care Management [phone]

## 2021-03-24 NOTE — Telephone Encounter (Signed)
This encounter was created in error - please disregard.

## 2021-03-24 NOTE — Telephone Encounter (Deleted)
   03/24/2021  Somers Nation 05-May-1951 [id number]  VM received from patient's son to discuss placement process and options. Message left for patient's son requesting to schedule a day and time to discuss his concerns. This Child psychotherapist will await return call from patient's son.    Verna Czech, LCSW Clinical Social Worker  [facility] Care [facility] [phone]

## 2021-04-01 ENCOUNTER — Ambulatory Visit (INDEPENDENT_AMBULATORY_CARE_PROVIDER_SITE_OTHER): Payer: Medicare Other | Admitting: *Deleted

## 2021-04-01 DIAGNOSIS — E785 Hyperlipidemia, unspecified: Secondary | ICD-10-CM

## 2021-04-01 DIAGNOSIS — R413 Other amnesia: Secondary | ICD-10-CM

## 2021-04-01 NOTE — Patient Instructions (Signed)
Visit Information   Goals Addressed             This Visit's Progress    Find Help in My Community       Timeframe:  Long-Range Goal Priority:  High Start Date:    04/01/21                         Expected End Date: 10/02/20                  Follow Up Date 05/02/21    - follow-up on any referrals for help I am given - think ahead to make sure my need does not become an emergency - have a back-up plan - make a list of family or friends that I can call    Why is this important?   Knowing how and where to find help for yourself or family in your neighborhood and community is an important skill.  You will want to take some steps to learn how.    Notes:          The patient verbalized understanding of instructions, educational materials, and care plan provided today and declined offer to receive copy of patient instructions, educational materials, and care plan.   Telephone follow up appointment with care management team member scheduled for: 04/29/10   Verna Czech, LCSW Clinical Social Worker  [facility] Care Management [phone]

## 2021-04-01 NOTE — Chronic Care Management (AMB) (Addendum)
Chronic Care Management    Clinical Social Work Note  04/01/2021 Name: Walter [name] MRN: [id number] DOB: July 11, 1951  Walter [name] is a 70 y.o. year old male who is a primary care patient of Chrismon, Jodell Cipro, PA-C. The CCM team was consulted to assist the patient with chronic disease management and/or care coordination needs related to: Level of Care Concerns.   Collaboration with patient's son and daughter in law  for follow up visit in response to provider referral for social work chronic care management and care coordination services.   Consent to Services:  The patient was given information about Chronic Care Management services, agreed to services, and gave verbal consent prior to initiation of services.  Please see initial visit note for detailed documentation.   Patient agreed to services and consent obtained.   Assessment: Review of patient past medical history, allergies, medications, and health status, including review of relevant consultants reports was performed today as part of a comprehensive evaluation and provision of chronic care management and care coordination services.     SDOH (Social Determinants of Health) assessments and interventions performed:    Advanced Directives Status: Not addressed in this encounter.  CCM Care Plan  Allergies  Allergen Reactions   Antihistamines, Chlorpheniramine-Type     Outpatient Encounter Medications as of 04/01/2021  Medication Sig Note   ASHWAGANDHA PO Take 800 mg by mouth daily.     Biotin 1 MG CAPS Take 10,000 mcg by mouth daily.    bismuth subsalicylate (PEPTO BISMOL) 262 MG chewable tablet Chew 524 mg by mouth as needed.    Cinnamon 500 MG capsule Take 500 mg by mouth daily.  (Patient not taking: Reported on 09/09/2020)    Coenzyme Q10 (COQ-10) 10 MG CAPS Take 120 mg by mouth daily.     Cyanocobalamin (VITAMIN B-12) 2500 MCG SUBL Place 3 each daily under the tongue.  (Patient not taking: Reported on 09/09/2020)     Cyanocobalamin (VITAMIN B-12) 5000 MCG LOZG Take by mouth daily.    cyanocobalamin 100 MCG tablet Take 100 mcg by mouth daily.  (Patient not taking: Reported on 09/09/2020)    Glucosamine-Chondroitin (OSTEO BI-FLEX REGULAR STRENGTH PO) Take by mouth daily.  (Patient not taking: Reported on 09/09/2020)    MAGNESIUM PO Take 400 mg daily by mouth.     Methylsulfonylmethane (MSM) 1000 MG CAPS Take daily by mouth. (Patient not taking: Reported on 09/09/2020)    milk thistle 175 MG tablet Take 175 mg by mouth daily.    Moringa Oleifera (MORINGA PO) Take by mouth daily.    naproxen sodium (ALEVE) 220 MG tablet Take 220 mg daily as needed by mouth.  08/13/2017: PRN    NON FORMULARY daily. Natures Bounty Anxiety and Stress Relief with Ashwagandha (Patient not taking: Reported on 09/09/2020)    OMEGA-3 FATTY ACIDS PO Take 1,200 mg daily by mouth.     Red Yeast Rice Extract (RED YEAST RICE PO) Take 600 mg daily by mouth.     rivastigmine (EXELON) 1.5 MG capsule Take 1.5 mg by mouth 2 (two) times daily.    Turmeric 500 MG CAPS Take 1,500 mg by mouth daily.    UNABLE TO FIND CBD 250 mg daily (Patient not taking: Reported on 09/09/2020)    UNABLE TO FIND daily. Red beet powder    vitamin C (ASCORBIC ACID) 500 MG tablet Take 500 mg daily by mouth.     Vitamin D, Cholecalciferol, 400 units TABS Take 2,000 mg by mouth  daily.     vitamin E 1000 UNIT capsule Take 180 Units by mouth daily.     Vitamin E 180 MG CAPS Take 180 mg by mouth daily. (Patient not taking: Reported on 09/09/2020)    zinc gluconate 50 MG tablet Take 50 mg by mouth daily.    No facility-administered encounter medications on file as of 04/01/2021.    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   Colon, diverticulosis 12/13/2006    Conditions to be addressed/monitored: Dementia; Level of care concerns  Care Plan : Dementia (Adult)  Updates made by Wenda Overland, LCSW since 04/01/2021 12:00 AM     Problem: Long-Term Care Planning      Goal: Effective Long-Term Care Planning   Start Date: 04/01/2021  Expected End Date: 11/03/2021  This Visit's Progress: On track  Priority: Medium  Note:   Current Barriers:  Chronic need related to impairment in memory affecting ability to manage self care activities Level of care concerns Suicidal Ideation/Homicidal Ideation: No  Clinical Social Work Goal(s):  Over the next 90 days, patient's family will work with SW monthly by telephone or in person to reduce or manage symptoms related to memory impairments effecting patient's ability to manage personal responsibilities in the home  Interventions: Patient son (POA) and daughter in law interviewed and appropriate assessments performed:  Patient's son and daughter in law discussed concern regarding patient's ability to manage his condition, spouse now overwhelmed with patient's care Patient' son and spouse confirmed that best efforts are made to provide care, however they do not live locally(son now POA, manages patient's fiances, groceries ordered online, medication reminders) both discussed long term plan for out of home placement, however would like suggestions for in home care while final decision is being made regarding out of home care. HH services discussed with the possibility of re-visiting the possibility of PT, OT and an aid as well as hiring a private duty aid. It was explained that medicare does not cover in home aid care. Referral for meals on wheels discussed as well and will be completed as both patient and spouse do not drive and are home bound-VM left with the [facility] to complete the referral Caregiver stress acknowledged, support and reassurance provided 3:02 pm  Return phone call from [facility], referral completed, estimated wait time 2 months. They will also call patient's son for additional information  Patient Self Care Activities:  Attends all scheduled provider appointments  Patient Coping Strengths:  Family  Patient Self Care Deficits:  Unable to perform ADLs independently Unable to perform IADLs independently  Please see past updates related to this goal by clicking on the "Past Updates" button in the selected goal      Task: Facilitate Activities of Long-Term Care Planning   Due Date: 11/03/2021  Priority: Routine  Note:   Care Management Activities:    - caregiver support provided - community resource involvement encouraged - consideration of in-home help encouraged - decision-making supported    Notes:       Follow Up Plan: SW will follow up with patient by phone over the next 30 business days       Basilio Meadow, [place] Clinical Social Worker  [facility]  [facility] [phone]

## 2021-04-02 ENCOUNTER — Ambulatory Visit: Payer: Self-pay | Admitting: *Deleted

## 2021-04-02 DIAGNOSIS — E785 Hyperlipidemia, unspecified: Secondary | ICD-10-CM

## 2021-04-02 DIAGNOSIS — R413 Other amnesia: Secondary | ICD-10-CM

## 2021-04-02 NOTE — Patient Instructions (Addendum)
Visit Information   Goals Addressed             This Visit's Progress    Find Help in My Community       Timeframe:  Long-Range Goal Priority:  High Start Date:    04/01/21                         Expected End Date: 10/02/20                  Follow Up Date 05/02/21    - follow-up on any referrals for help I am given-Meals on Wheels, Moms' Meals, Home Health  - think ahead to make sure my need does not become an emergency - have a back-up plan - make a list of family or friends that I can call    Why is this important?   Knowing how and where to find help for yourself or family in your neighborhood and community is an important skill.  You will want to take some steps to learn how.    Notes:          The patient verbalized understanding of instructions, educational materials, and care plan provided today and declined offer to receive copy of patient instructions, educational materials, and care plan.   Telephone follow up appointment with care management team member scheduled for:04/29/21   Verna Czech, [place] Clinical Social Worker  [facility] [phone]

## 2021-04-02 NOTE — Chronic Care Management (AMB) (Addendum)
Chronic Care Management    Clinical Social Work Note  04/02/2021 Name: Walter [name] MRN: [id number] DOB: [birth year]  Walter [name] is a 70 y.o. year old male who is a primary care patient of Chrismon, Jodell Cipro, PA-C. The CCM team was consulted to assist the patient with chronic disease management and/or care coordination needs related to: [employer] .   Collaboration with patient's son  for follow up visit in response to provider referral for social work chronic care management and care coordination services.   Consent to Services:  The patient was given information about Chronic Care Management services, agreed to services, and gave verbal consent prior to initiation of services.  Please see initial visit note for detailed documentation.   Patient agreed to services and consent obtained.   Assessment: Review of patient past medical history, allergies, medications, and health status, including review of relevant consultants reports was performed today as part of a comprehensive evaluation and provision of chronic care management and care coordination services.     SDOH (Social Determinants of Health) assessments and interventions performed:    Advanced Directives Status: Not addressed in this encounter.  CCM Care Plan  Allergies  Allergen Reactions   Antihistamines, Chlorpheniramine-Type     Outpatient Encounter Medications as of 04/02/2021  Medication Sig Note   ASHWAGANDHA PO Take 800 mg by mouth daily.     Biotin 1 MG CAPS Take 10,000 mcg by mouth daily.    bismuth subsalicylate (PEPTO BISMOL) 262 MG chewable tablet Chew 524 mg by mouth as needed.    Cinnamon 500 MG capsule Take 500 mg by mouth daily.  (Patient not taking: Reported on 09/09/2020)    Coenzyme Q10 (COQ-10) 10 MG CAPS Take 120 mg by mouth daily.     Cyanocobalamin (VITAMIN B-12) 2500 MCG SUBL Place 3 each daily under the tongue.  (Patient not taking: Reported on 09/09/2020)    Cyanocobalamin (VITAMIN  B-12) 5000 MCG LOZG Take by mouth daily.    cyanocobalamin 100 MCG tablet Take 100 mcg by mouth daily.  (Patient not taking: Reported on 09/09/2020)    Glucosamine-Chondroitin (OSTEO BI-FLEX REGULAR STRENGTH PO) Take by mouth daily.  (Patient not taking: Reported on 09/09/2020)    MAGNESIUM PO Take 400 mg daily by mouth.     Methylsulfonylmethane (MSM) 1000 MG CAPS Take daily by mouth. (Patient not taking: Reported on 09/09/2020)    milk thistle 175 MG tablet Take 175 mg by mouth daily.    Moringa Oleifera (MORINGA PO) Take by mouth daily.    naproxen sodium (ALEVE) 220 MG tablet Take 220 mg daily as needed by mouth.  08/13/2017: PRN    NON FORMULARY daily. Natures Bounty Anxiety and Stress Relief with Ashwagandha (Patient not taking: Reported on 09/09/2020)    OMEGA-3 FATTY ACIDS PO Take 1,200 mg daily by mouth.     Red Yeast Rice Extract (RED YEAST RICE PO) Take 600 mg daily by mouth.     rivastigmine (EXELON) 1.5 MG capsule Take 1.5 mg by mouth 2 (two) times daily.    Turmeric 500 MG CAPS Take 1,500 mg by mouth daily.    UNABLE TO FIND CBD 250 mg daily (Patient not taking: Reported on 09/09/2020)    UNABLE TO FIND daily. Red beet powder    vitamin C (ASCORBIC ACID) 500 MG tablet Take 500 mg daily by mouth.     Vitamin D, Cholecalciferol, 400 units TABS Take 2,000 mg by mouth daily.  vitamin E 1000 UNIT capsule Take 180 Units by mouth daily.     Vitamin E 180 MG CAPS Take 180 mg by mouth daily. (Patient not taking: Reported on 09/09/2020)    zinc gluconate 50 MG tablet Take 50 mg by mouth daily.    No facility-administered encounter medications on file as of 04/02/2021.    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   Colon,  diverticulosis 12/13/2006    Conditions to be addressed/monitored: Dementia; Memory Deficits  Care Plan : Dementia (Adult)  Updates made by Wenda Overland, LCSW since 04/02/2021 12:00 AM     Problem: Long-Term Care Planning      Goal: Effective Long-Term Care Planning   Start Date: 04/01/2021  Expected End Date: 11/03/2021  Recent Progress: On track  Priority: Medium  Note:   Current Barriers:  Chronic need related to impairment in memory affecting ability to manage self care activities Level of care concerns Suicidal Ideation/Homicidal Ideation: No  Clinical Social Work Goal(s):  Over the next 90 days, patient's family will work with SW monthly by telephone or in person to reduce or manage symptoms related to memory impairments effecting patient's ability to manage personal responsibilities in the home  Interventions: Patient son (POA) and daughter in law interviewed and appropriate assessments performed:  Patient's son and daughter in law discussed concern regarding patient's ability to manage his condition, spouse now overwhelmed with patient's care Patient' son and spouse confirmed that best efforts are made to provide care, however they do not live locally(son now POA, manages patient's fiances, groceries ordered online, medication reminders) both discussed long term plan for out of home placement, however would like suggestions for in home care while final decision is being made regarding out of home care. HH services discussed with the possibility of re-visiting the possibility of PT, OT and an aid as well as hiring a private duty aid. It was explained that medicare does not cover in home aid care. Referral for meals on wheels discussed as well and will be completed as both patient and spouse do not drive and are home bound-VM left with the [facility] to complete the referral Caregiver stress acknowledged, support and reassurance provided 3:02 pm Return phone call from [facility], referral completed, estimated wait time 2 months. They will also call patient's son for additional information 04/02/21 Phone call to patient's son to confirm referral made to Meals [facility] Wheels on patient's behalf. Patient's son confirmed that he received a call as well and provided patient's spouse's information so that they both can begin the service Confirmed that a referral will be placed for a bridge program that will provide meals while in the waiting list for [facility]. Patient's son discussed plan to discuss [facility] for patient as well as referral for meals with patient and his spouse this weekend and will follow up with this social worker regarding in home needs.  Patient Self Care Activities:  Attends all scheduled provider appointments  Patient Coping Strengths:  Family  Patient Self Care Deficits:  Unable to perform ADLs independently Unable to perform IADLs independently  Please see past updates related to this goal by clicking on the "Past Updates" button in the selected goal  Follow Up Plan: SW will follow up with patient by phone over the next 14 business days       [place], [place] Clinical Social Worker  [facility] [phone]

## 2021-04-29 ENCOUNTER — Ambulatory Visit (INDEPENDENT_AMBULATORY_CARE_PROVIDER_SITE_OTHER): Payer: Medicare Other | Admitting: *Deleted

## 2021-04-29 ENCOUNTER — Encounter: Payer: Self-pay | Admitting: *Deleted

## 2021-04-29 DIAGNOSIS — R413 Other amnesia: Secondary | ICD-10-CM

## 2021-04-29 DIAGNOSIS — E785 Hyperlipidemia, unspecified: Secondary | ICD-10-CM

## 2021-04-29 NOTE — Patient Instructions (Signed)
Visit Information   Goals Addressed             This Visit's Progress    Find Help in My Community       Timeframe:  Long-Range Goal Priority:  High Start Date:    04/01/21                         Expected End Date: 10/02/20                  Follow Up Date 04/29/21   - follow-up on any referrals for help I am given-[facility], Meals on Wheels - think ahead to make sure my need does not become an emergency - have a back-up plan - make a list of family or friends that I can call    Why is this important?   Knowing how and where to find help for yourself or family in your neighborhood and community is an important skill.  You will want to take some steps to learn how.    Notes:         The patient verbalized understanding of instructions, educational materials, and care plan provided today and declined offer to receive copy of patient instructions, educational materials, and care plan.   No further follow up required: patient's to call this Child psychotherapist with any additional community resource needs   [employer], LCSW Clinical Social Worker  [facility] [phone]

## 2021-04-29 NOTE — Chronic Care Management (AMB) (Signed)
Chronic Care Management    Clinical Social Work Note  04/29/2021 Name: DEWAUN KINZLER MRN: [id number] DOB: Nov 03, 1950  SAVEON PLANT is a 70 y.o. year old male who is a primary care patient of Chrismon, Jodell Cipro, PA-C. The CCM team was consulted to assist the patient with chronic disease management and/or care coordination needs related to: [employer] .   Collaboration with patient's son  for follow up visit in response to provider referral for social work chronic care management and care coordination services.   Consent to Services:  The patient was given information about Chronic Care Management services, agreed to services, and gave verbal consent prior to initiation of services.  Please see initial visit note for detailed documentation.   Patient agreed to services and consent obtained.   Assessment: Review of patient past medical history, allergies, medications, and health status, including review of relevant consultants reports was performed today as part of a comprehensive evaluation and provision of chronic care management and care coordination services.     SDOH (Social Determinants of Health) assessments and interventions performed:    Advanced Directives Status: Not addressed in this encounter.  CCM Care Plan  Allergies  Allergen Reactions   Antihistamines, Chlorpheniramine-Type     Outpatient Encounter Medications as of 04/29/2021  Medication Sig Note   ASHWAGANDHA PO Take 800 mg by mouth daily.     Biotin 1 MG CAPS Take 10,000 mcg by mouth daily.    bismuth subsalicylate (PEPTO BISMOL) 262 MG chewable tablet Chew 524 mg by mouth as needed.    Cinnamon 500 MG capsule Take 500 mg by mouth daily.  (Patient not taking: Reported on 09/09/2020)    Coenzyme Q10 (COQ-10) 10 MG CAPS Take 120 mg by mouth daily.     Cyanocobalamin (VITAMIN B-12) 2500 MCG SUBL Place 3 each daily under the tongue.  (Patient not taking: Reported on 09/09/2020)    Cyanocobalamin (VITAMIN  B-12) 5000 MCG LOZG Take by mouth daily.    cyanocobalamin 100 MCG tablet Take 100 mcg by mouth daily.  (Patient not taking: Reported on 09/09/2020)    Glucosamine-Chondroitin (OSTEO BI-FLEX REGULAR STRENGTH PO) Take by mouth daily.  (Patient not taking: Reported on 09/09/2020)    MAGNESIUM PO Take 400 mg daily by mouth.     Methylsulfonylmethane (MSM) 1000 MG CAPS Take daily by mouth. (Patient not taking: Reported on 09/09/2020)    milk thistle 175 MG tablet Take 175 mg by mouth daily.    Moringa Oleifera (MORINGA PO) Take by mouth daily.    naproxen sodium (ALEVE) 220 MG tablet Take 220 mg daily as needed by mouth.  08/13/2017: PRN    NON FORMULARY daily. Natures Bounty Anxiety and Stress Relief with Ashwagandha (Patient not taking: Reported on 09/09/2020)    OMEGA-3 FATTY ACIDS PO Take 1,200 mg daily by mouth.     Red Yeast Rice Extract (RED YEAST RICE PO) Take 600 mg daily by mouth.     rivastigmine (EXELON) 1.5 MG capsule Take 1.5 mg by mouth 2 (two) times daily.    Turmeric 500 MG CAPS Take 1,500 mg by mouth daily.    UNABLE TO FIND CBD 250 mg daily (Patient not taking: Reported on 09/09/2020)    UNABLE TO FIND daily. Red beet powder    vitamin C (ASCORBIC ACID) 500 MG tablet Take 500 mg daily by mouth.     Vitamin D, Cholecalciferol, 400 units TABS Take 2,000 mg by mouth daily.  vitamin E 1000 UNIT capsule Take 180 Units by mouth daily.     Vitamin E 180 MG CAPS Take 180 mg by mouth daily. (Patient not taking: Reported on 09/09/2020)    zinc gluconate 50 MG tablet Take 50 mg by mouth daily.    No facility-administered encounter medications on file as of 04/29/2021.    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   Colon,  diverticulosis 12/13/2006    Conditions to be addressed/monitored: Dementia; Memory Deficits  Care Plan : Dementia (Adult)  Updates made by Wenda Overland, LCSW since 04/29/2021 12:00 AM     Problem: Long-Term Care Planning      Goal: Effective Long-Term Care Planning   Start Date: 04/01/2021  Expected End Date: 11/03/2021  Recent Progress: On track  Priority: Medium  Note:   Current Barriers:  Chronic need related to impairment in memory affecting ability to manage self care activities Level of care concerns Suicidal Ideation/Homicidal Ideation: No  Clinical Social Work Goal(s):  Over the next 90 days, patient's family will work with SW monthly by telephone or in person to reduce or manage symptoms related to memory impairments effecting patient's ability to manage personal responsibilities in the home  Interventions: Patient son (POA) and appropriate assessments performed:  Patient's son confirms continued efforts to provide care for patient-Meals on Wheels referral completed and pending, patient's son further discussed plan to contact [facility] to coordinate private duty care for patient [facility] services re-visited, however patient refuses this option-capacity discussed including ability to choose Private duty care recommended for in home assistance and safety Caregiver stress acknowledged, support and reassurance provided Patient's son aware of available community resource options to assist patient and will continue to consider all options discussed.   Patient Self Care Activities:  Attends all scheduled provider appointments  Patient Coping Strengths:  Family  Patient Self Care Deficits:  Unable to perform ADLs independently Unable to perform IADLs independently  Please see past updates related to this goal by clicking on the "Past Updates" button in the selected goal        Follow Up Plan: patient's son to contact this social worker with any additional community  resource needs.      Verna Czech, LCSW [facility] [phone]

## 2021-04-29 NOTE — Telephone Encounter (Signed)
This encounter was created in error - please disregard.

## 2021-07-17 DIAGNOSIS — R251 Tremor, unspecified: Secondary | ICD-10-CM | POA: Diagnosis not present

## 2021-07-17 DIAGNOSIS — G4700 Insomnia, unspecified: Secondary | ICD-10-CM | POA: Diagnosis not present

## 2021-07-17 DIAGNOSIS — F02818 Dementia in other diseases classified elsewhere, unspecified severity, with other behavioral disturbance: Secondary | ICD-10-CM | POA: Diagnosis not present

## 2021-07-17 DIAGNOSIS — G249 Dystonia, unspecified: Secondary | ICD-10-CM | POA: Diagnosis not present

## 2021-07-28 ENCOUNTER — Telehealth: Payer: Self-pay

## 2021-07-28 NOTE — Telephone Encounter (Signed)
Copied from CRM [id number]. Topic: Appointment Scheduling - Scheduling Inquiry for Clinic >> Jul 28, 2021  4:20 PM Pawlus, Maxine Glenn A wrote: Reason for CRM: Pt wanted to schedule an appt to get a lump on his neck checked out, please advise it pt can get his flu shot and possibly have an appt as well. Pt can not drive so it is difficult to get to appointments.

## 2021-07-30 ENCOUNTER — Ambulatory Visit: Payer: Medicare Other

## 2021-07-30 ENCOUNTER — Ambulatory Visit (INDEPENDENT_AMBULATORY_CARE_PROVIDER_SITE_OTHER): Payer: Medicare Other | Admitting: Physician Assistant

## 2021-07-30 ENCOUNTER — Encounter: Payer: Self-pay | Admitting: Physician Assistant

## 2021-07-30 ENCOUNTER — Other Ambulatory Visit: Payer: Self-pay

## 2021-07-30 VITALS — BP 123/66 | HR 74 | Temp 98.4°F | Ht 75.0 in | Wt 207.5 lb

## 2021-07-30 DIAGNOSIS — Z23 Encounter for immunization: Secondary | ICD-10-CM

## 2021-07-30 DIAGNOSIS — R590 Localized enlarged lymph nodes: Secondary | ICD-10-CM | POA: Diagnosis not present

## 2021-07-30 NOTE — Progress Notes (Signed)
Established patient visit   Patient: Walter [name]   DOB: 28-Apr-1951   70 y.o. Male  MRN: [id number] Visit Date: 07/30/2021  Today's healthcare provider: Alfredia Ferguson, PA-C   Chief Complaint  Patient presents with   Mass    Back of neck right side   Immunizations    influenza   Subjective    HPI HPI     Mass    Additional comments: Back of neck right side        Immunizations    Additional comments: influenza      Last edited by Acey Lav, CMA on 07/30/2021  3:49 PM.      Walter [name] is a 70 y/o with dementia w/ behavioral disturbance, managed by neuro, who presents today with his wife with concerns over a lump on the back of his neck. He states his hairdresser noticed this lump last week, it was new to her. Denies any associated pain, injury. No current viral illness.      Medications: Outpatient Medications Prior to Visit  Medication Sig   ASHWAGANDHA PO Take 800 mg by mouth daily.    Biotin 1 MG CAPS Take 10,000 mcg by mouth daily.   bismuth subsalicylate (PEPTO BISMOL) 262 MG chewable tablet Chew 524 mg by mouth as needed.   Cinnamon 500 MG capsule Take 500 mg by mouth daily.   Coenzyme Q10 (COQ-10) 10 MG CAPS Take 120 mg by mouth daily.    Cyanocobalamin (VITAMIN B-12) 2500 MCG SUBL Place 3 each under the tongue daily.   Cyanocobalamin (VITAMIN B-12) 5000 MCG LOZG Take by mouth daily.   cyanocobalamin 100 MCG tablet Take 100 mcg by mouth daily.   Glucosamine-Chondroitin (OSTEO BI-FLEX REGULAR STRENGTH PO) Take by mouth daily.   MAGNESIUM PO Take 400 mg daily by mouth.    Methylsulfonylmethane (MSM) 1000 MG CAPS Take by mouth daily.   milk thistle 175 MG tablet Take 175 mg by mouth daily.   Moringa Oleifera (MORINGA PO) Take by mouth daily.   naproxen sodium (ALEVE) 220 MG tablet Take 220 mg daily as needed by mouth.    NON FORMULARY daily. Natures Bounty Anxiety and Stress Relief with Ashwagandha   OMEGA-3 FATTY ACIDS PO Take 1,200 mg daily  by mouth.    Red Yeast Rice Extract (RED YEAST RICE PO) Take 600 mg daily by mouth.    rivastigmine (EXELON) 1.5 MG capsule Take 1.5 mg by mouth 2 (two) times daily.   rivastigmine (EXELON) 3 MG capsule Take 3 mg by mouth in the morning and at bedtime.   Turmeric 500 MG CAPS Take 1,500 mg by mouth daily.   UNABLE TO FIND CBD 250 mg daily   UNABLE TO FIND daily. Red beet powder   vitamin C (ASCORBIC ACID) 500 MG tablet Take 500 mg daily by mouth.    Vitamin D, Cholecalciferol, 400 units TABS Take 2,000 mg by mouth daily.    vitamin E 1000 UNIT capsule Take 180 Units by mouth daily.    Vitamin E 180 MG CAPS Take 180 mg by mouth daily.   zinc gluconate 50 MG tablet Take 50 mg by mouth daily.   No facility-administered medications prior to visit.    Review of Systems  Skin:        Mass on neck   All other systems reviewed and are negative.  Last CBC Lab Results  Component Value Date   WBC 4.7 07/08/2020   HGB 14.9 07/08/2020  HCT 43.1 07/08/2020   MCV 93 07/08/2020   MCH 32.3 07/08/2020   RDW 11.8 07/08/2020   PLT 195 07/08/2020   Last metabolic panel Lab Results  Component Value Date   GLUCOSE 76 07/08/2020   NA 138 07/08/2020   K 4.5 07/08/2020   CL 101 07/08/2020   CO2 25 07/08/2020   BUN 10 07/08/2020   CREATININE 1.01 07/08/2020   GFRNONAA 76 07/08/2020   CALCIUM 9.2 07/08/2020   PROT 6.6 07/08/2020   ALBUMIN 4.3 07/08/2020   LABGLOB 2.3 07/08/2020   AGRATIO 1.9 07/08/2020   BILITOT 0.7 07/08/2020   ALKPHOS 80 07/08/2020   AST 17 07/08/2020   ALT 14 07/08/2020   Last lipids Lab Results  Component Value Date   CHOL 155 08/16/2017   HDL 56 08/16/2017   LDLCALC 87 08/16/2017   TRIG 50 08/16/2017   CHOLHDL 2.8 08/16/2017   Last hemoglobin A1c No results found for: HGBA1C Last thyroid functions Lab Results  Component Value Date   TSH 0.858 12/04/2019   Last vitamin D Lab Results  Component Value Date   VD25OH 38.5 12/04/2019   Last vitamin B12  and Folate No results found for: VITAMINB12, FOLATE     Objective    BP 123/66   Pulse 74   Temp 98.4 F (36.9 C)   Ht 6\' 3"  (1.905 m)   Wt 207 lb 8 oz (94.1 kg)   SpO2 100%   BMI 25.94 kg/m  BP Readings from Last 3 Encounters:  07/30/21 123/66  07/08/20 137/79  03/22/20 (!) 146/71   Wt Readings from Last 3 Encounters:  07/30/21 207 lb 8 oz (94.1 kg)  07/08/20 229 lb (103.9 kg)  03/22/20 232 lb (105.2 kg)      Physical Exam Constitutional:      General: He is not in acute distress. Neck:     Comments: Right superior posterior cervical lymphadenopathy--one 2-3 cm soft mobile node. Nontender Cardiovascular:     Rate and Rhythm: Normal rate and regular rhythm.     Heart sounds: Normal heart sounds.  Pulmonary:     Effort: Pulmonary effort is normal.     Breath sounds: Normal breath sounds.  Lymphadenopathy:     Cervical: Cervical adenopathy present.  Neurological:     Mental Status: Mental status is at baseline. He is disoriented.  Psychiatric:        Mood and Affect: Mood is anxious.        Behavior: Behavior is cooperative.        Cognition and Memory: Cognition is impaired. Memory is impaired. He exhibits impaired recent memory and impaired remote memory.      No results found for any visits on 07/30/21.  Assessment & Plan     Right posterior cervical adenopathy 13/02/22 soft tissue neck to r/o malignancy, suspicion low. Labs, CBC CMP  Return in about 6 months (around 01/27/2022) for wellness visit.      I, 03/29/2022, PA-C have reviewed all documentation for this visit. The documentation on  07/30/2021  for the exam, diagnosis, procedures, and orders are all accurate and complete.    13/10/2020, PA-C  [facility] [phone] (phone) [phone] (fax)  [facility]

## 2021-07-31 LAB — CBC WITH DIFFERENTIAL/PLATELET
Basophils Absolute: 0 10*3/uL (ref 0.0–0.2)
Basos: 1 %
EOS (ABSOLUTE): 0.1 10*3/uL (ref 0.0–0.4)
Eos: 2 %
Hematocrit: 40.4 % (ref 37.5–51.0)
Hemoglobin: 14.1 g/dL (ref 13.0–17.7)
Immature Grans (Abs): 0 10*3/uL (ref 0.0–0.1)
Immature Granulocytes: 0 %
Lymphocytes Absolute: 1.6 10*3/uL (ref 0.7–3.1)
Lymphs: 33 %
MCH: 32.5 pg (ref 26.6–33.0)
MCHC: 34.9 g/dL (ref 31.5–35.7)
MCV: 93 fL (ref 79–97)
Monocytes Absolute: 0.7 10*3/uL (ref 0.1–0.9)
Monocytes: 14 %
Neutrophils Absolute: 2.5 10*3/uL (ref 1.4–7.0)
Neutrophils: 50 %
Platelets: 233 10*3/uL (ref 150–450)
RBC: 4.34 x10E6/uL (ref 4.14–5.80)
RDW: 11.8 % (ref 11.6–15.4)
WBC: 5 10*3/uL (ref 3.4–10.8)

## 2021-07-31 LAB — COMPREHENSIVE METABOLIC PANEL
ALT: 21 IU/L (ref 0–44)
AST: 20 IU/L (ref 0–40)
Albumin/Globulin Ratio: 1.8 (ref 1.2–2.2)
Albumin: 4.3 g/dL (ref 3.7–4.7)
Alkaline Phosphatase: 84 IU/L (ref 44–121)
BUN/Creatinine Ratio: 11 (ref 10–24)
BUN: 10 mg/dL (ref 8–27)
Bilirubin Total: 1.1 mg/dL (ref 0.0–1.2)
CO2: 27 mmol/L (ref 20–29)
Calcium: 9.2 mg/dL (ref 8.6–10.2)
Chloride: 99 mmol/L (ref 96–106)
Creatinine, Ser: 0.92 mg/dL (ref 0.76–1.27)
Globulin, Total: 2.4 g/dL (ref 1.5–4.5)
Glucose: 75 mg/dL (ref 70–99)
Potassium: 4.6 mmol/L (ref 3.5–5.2)
Sodium: 140 mmol/L (ref 134–144)
Total Protein: 6.7 g/dL (ref 6.0–8.5)
eGFR: 89 mL/min/{1.73_m2} (ref >59)

## 2021-08-04 ENCOUNTER — Telehealth: Payer: Self-pay | Admitting: Physician Assistant

## 2021-08-04 NOTE — Telephone Encounter (Signed)
Pt's wife Arline Asp called stating that pt's rivastigmine (EXELON) was increased to 3 MG . The print out pt got said to take 3x daily . Chart states 2xdaily. Please advise

## 2021-08-05 ENCOUNTER — Ambulatory Visit: Payer: Medicare Other

## 2021-08-13 ENCOUNTER — Ambulatory Visit
Admission: RE | Admit: 2021-08-13 | Discharge: 2021-08-13 | Disposition: A | Discharge status: Home / Self Care | Payer: Medicare Other | Source: Ambulatory Visit | Attending: Physician Assistant | Admitting: Physician Assistant

## 2021-08-13 ENCOUNTER — Other Ambulatory Visit: Payer: Self-pay

## 2021-08-13 DIAGNOSIS — R590 Localized enlarged lymph nodes: Secondary | ICD-10-CM | POA: Diagnosis not present

## 2021-08-14 DIAGNOSIS — F02818 Dementia in other diseases classified elsewhere, unspecified severity, with other behavioral disturbance: Secondary | ICD-10-CM | POA: Diagnosis not present

## 2021-08-14 DIAGNOSIS — G249 Dystonia, unspecified: Secondary | ICD-10-CM | POA: Diagnosis not present

## 2021-08-14 DIAGNOSIS — G4700 Insomnia, unspecified: Secondary | ICD-10-CM | POA: Diagnosis not present

## 2021-08-14 NOTE — Progress Notes (Signed)
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 Patient can monitor if it changes in size but no further testing needed.

## 2021-08-27 ENCOUNTER — Telehealth: Payer: Self-pay

## 2021-08-27 NOTE — Telephone Encounter (Signed)
Advised that we order blood test and to ask facility if they will accept that. Patient will return call.

## 2021-08-27 NOTE — Telephone Encounter (Signed)
Copied from CRM [id number]. Topic: General - Other >> Aug 27, 2021  3:13 PM Glean Salen wrote: Reason for CRM: Patient's son called in to schedule TB skin test. Please call back

## 2021-09-10 ENCOUNTER — Telehealth: Payer: Self-pay

## 2021-09-10 NOTE — Telephone Encounter (Signed)
Just an FYI   Copied from CRM [id number]. Topic: General - Other >> Sep 10, 2021  9:45 AM Glean Salen wrote: Reason for CZY:[id number] wife cancel appt for Dec 19.fr or AWV. she isnt feeling welll and patient has been transferred to a memory faciltity

## 2021-09-15 DIAGNOSIS — J069 Acute upper respiratory infection, unspecified: Secondary | ICD-10-CM | POA: Diagnosis not present

## 2021-09-30 DIAGNOSIS — F03B2 Unspecified dementia, moderate, with psychotic disturbance: Secondary | ICD-10-CM | POA: Diagnosis not present

## 2021-10-01 DIAGNOSIS — F02818 Dementia in other diseases classified elsewhere, unspecified severity, with other behavioral disturbance: Secondary | ICD-10-CM | POA: Diagnosis not present

## 2021-10-02 DIAGNOSIS — F02818 Dementia in other diseases classified elsewhere, unspecified severity, with other behavioral disturbance: Secondary | ICD-10-CM | POA: Diagnosis not present

## 2021-10-07 DIAGNOSIS — F02818 Dementia in other diseases classified elsewhere, unspecified severity, with other behavioral disturbance: Secondary | ICD-10-CM | POA: Diagnosis not present

## 2021-10-09 DIAGNOSIS — F02818 Dementia in other diseases classified elsewhere, unspecified severity, with other behavioral disturbance: Secondary | ICD-10-CM | POA: Diagnosis not present

## 2021-10-14 DIAGNOSIS — F02818 Dementia in other diseases classified elsewhere, unspecified severity, with other behavioral disturbance: Secondary | ICD-10-CM | POA: Diagnosis not present

## 2021-10-16 DIAGNOSIS — F02818 Dementia in other diseases classified elsewhere, unspecified severity, with other behavioral disturbance: Secondary | ICD-10-CM | POA: Diagnosis not present

## 2021-10-21 DIAGNOSIS — F02818 Dementia in other diseases classified elsewhere, unspecified severity, with other behavioral disturbance: Secondary | ICD-10-CM | POA: Diagnosis not present

## 2021-10-22 DIAGNOSIS — F02818 Dementia in other diseases classified elsewhere, unspecified severity, with other behavioral disturbance: Secondary | ICD-10-CM | POA: Diagnosis not present

## 2021-10-27 DIAGNOSIS — F02818 Dementia in other diseases classified elsewhere, unspecified severity, with other behavioral disturbance: Secondary | ICD-10-CM | POA: Diagnosis not present

## 2021-10-28 DIAGNOSIS — F02818 Dementia in other diseases classified elsewhere, unspecified severity, with other behavioral disturbance: Secondary | ICD-10-CM | POA: Diagnosis not present

## 2021-11-04 DIAGNOSIS — F02818 Dementia in other diseases classified elsewhere, unspecified severity, with other behavioral disturbance: Secondary | ICD-10-CM | POA: Diagnosis not present

## 2021-11-09 ENCOUNTER — Emergency Department (HOSPITAL_COMMUNITY): Payer: Medicare Other

## 2021-11-09 ENCOUNTER — Emergency Department (HOSPITAL_COMMUNITY)
Admission: EM | Admit: 2021-11-09 | Discharge: 2021-11-09 | Disposition: A | Discharge status: Home / Self Care | Payer: Medicare Other | Attending: Emergency Medicine | Admitting: Emergency Medicine

## 2021-11-09 DIAGNOSIS — H1132 Conjunctival hemorrhage, left eye: Secondary | ICD-10-CM

## 2021-11-09 DIAGNOSIS — M2578 Osteophyte, vertebrae: Secondary | ICD-10-CM | POA: Diagnosis not present

## 2021-11-09 DIAGNOSIS — X58XXXA Exposure to other specified factors, initial encounter: Secondary | ICD-10-CM | POA: Diagnosis not present

## 2021-11-09 DIAGNOSIS — Z743 Need for continuous supervision: Secondary | ICD-10-CM | POA: Diagnosis not present

## 2021-11-09 DIAGNOSIS — Z041 Encounter for examination and observation following transport accident: Secondary | ICD-10-CM | POA: Diagnosis not present

## 2021-11-09 DIAGNOSIS — F03911 Unspecified dementia, unspecified severity, with agitation: Secondary | ICD-10-CM

## 2021-11-09 DIAGNOSIS — R58 Hemorrhage, not elsewhere classified: Secondary | ICD-10-CM | POA: Diagnosis not present

## 2021-11-09 DIAGNOSIS — H05232 Hemorrhage of left orbit: Secondary | ICD-10-CM

## 2021-11-09 DIAGNOSIS — Z79899 Other long term (current) drug therapy: Secondary | ICD-10-CM | POA: Insufficient documentation

## 2021-11-09 DIAGNOSIS — R6889 Other general symptoms and signs: Secondary | ICD-10-CM | POA: Diagnosis not present

## 2021-11-09 DIAGNOSIS — S0990XA Unspecified injury of head, initial encounter: Secondary | ICD-10-CM | POA: Diagnosis not present

## 2021-11-09 DIAGNOSIS — S0232XA Fracture of orbital floor, left side, initial encounter for closed fracture: Secondary | ICD-10-CM

## 2021-11-09 DIAGNOSIS — S0993XA Unspecified injury of face, initial encounter: Secondary | ICD-10-CM | POA: Diagnosis present

## 2021-11-09 DIAGNOSIS — R040 Epistaxis: Secondary | ICD-10-CM | POA: Diagnosis not present

## 2021-11-09 DIAGNOSIS — S0012XA Contusion of left eyelid and periocular area, initial encounter: Secondary | ICD-10-CM | POA: Insufficient documentation

## 2021-11-09 DIAGNOSIS — F0390 Unspecified dementia without behavioral disturbance: Secondary | ICD-10-CM | POA: Insufficient documentation

## 2021-11-09 DIAGNOSIS — R609 Edema, unspecified: Secondary | ICD-10-CM | POA: Diagnosis not present

## 2021-11-09 MED ORDER — MIDAZOLAM HCL 2 MG/2ML IJ SOLN: 1.0000 mg | Freq: Once | INTRAMUSCULAR | Status: AC

## 2021-11-09 MED ORDER — LORAZEPAM 2 MG/ML IJ SOLN
0.5000 mg | Freq: Once | INTRAMUSCULAR | Status: AC
  Administered 2021-11-09: 0.5 mg via INTRAMUSCULAR
  Filled 2021-11-09: qty 1

## 2021-11-09 MED ORDER — MIDAZOLAM HCL 2 MG/2ML IJ SOLN
1.0000 mg | Freq: Once | INTRAMUSCULAR | Status: AC
  Administered 2021-11-09: 1 mg via INTRAVENOUS

## 2021-11-09 MED ORDER — HYDROCODONE-ACETAMINOPHEN 5-325 MG PO TABS
1.0000 | ORAL_TABLET | ORAL | 0 refills | Status: DC | PRN
Start: 2021-11-09 — End: 2021-12-19

## 2021-11-09 MED ORDER — OXYMETAZOLINE HCL 0.05 % NA SOLN: 1.0000 | Freq: Two times a day (BID) | NASAL | 0 refills | Status: AC

## 2021-11-09 MED ORDER — LORAZEPAM 2 MG/ML IJ SOLN
1.0000 mg | Freq: Once | INTRAMUSCULAR | Status: AC
  Administered 2021-11-09: 1 mg via INTRAMUSCULAR
  Filled 2021-11-09: qty 1

## 2021-11-09 MED ORDER — MIDAZOLAM HCL 2 MG/2ML IJ SOLN
1.0000 mg | Freq: Once | INTRAMUSCULAR | Status: AC
Start: 2021-11-09 — End: 2021-11-09
  Administered 2021-11-09: 2 mg via INTRAVENOUS
  Filled 2021-11-09: qty 2

## 2021-11-09 MED ORDER — ETOMIDATE 2 MG/ML IV SOLN: 10.0000 mg | Freq: Once | INTRAVENOUS | Status: AC

## 2021-11-09 MED ORDER — HALOPERIDOL LACTATE 5 MG/ML IJ SOLN
2.0000 mg | Freq: Once | INTRAMUSCULAR | Status: AC
  Administered 2021-11-09: 2 mg via INTRAMUSCULAR
  Filled 2021-11-09: qty 1

## 2021-11-09 MED ORDER — ETOMIDATE 2 MG/ML IV SOLN
INTRAVENOUS | Status: AC
  Administered 2021-11-09: 10 mg via INTRAVENOUS
  Filled 2021-11-09: qty 10

## 2021-11-09 MED ORDER — MIDAZOLAM HCL 2 MG/2ML IJ SOLN
INTRAMUSCULAR | Status: AC
  Administered 2021-11-09: 1 mg via INTRAVENOUS
  Filled 2021-11-09: qty 2

## 2021-11-09 MED ORDER — ERYTHROMYCIN 5 MG/GM OP OINT
TOPICAL_OINTMENT | Freq: Three times a day (TID) | OPHTHALMIC | Status: DC
  Filled 2021-11-09: qty 3.5

## 2021-11-09 MED ORDER — HALOPERIDOL LACTATE 5 MG/ML IJ SOLN
2.0000 mg | Freq: Once | INTRAMUSCULAR | Status: AC
Start: 2021-11-09 — End: 2021-11-09
  Administered 2021-11-09: 2 mg via INTRAVENOUS
  Filled 2021-11-09: qty 1

## 2021-11-09 MED ORDER — ETOMIDATE 2 MG/ML IV SOLN
10.0000 mg | Freq: Once | INTRAVENOUS | Status: AC
  Administered 2021-11-09: 15 mg via INTRAVENOUS
  Filled 2021-11-09: qty 10

## 2021-11-09 MED ORDER — ERYTHROMYCIN 5 MG/GM OP OINT: TOPICAL_OINTMENT | OPHTHALMIC | 0 refills | Status: AC

## 2021-11-09 MED ORDER — LORAZEPAM 2 MG/ML IJ SOLN
1.0000 mg | Freq: Once | INTRAMUSCULAR | Status: AC
Start: 2021-11-09 — End: 2021-11-09
  Administered 2021-11-09: 1 mg via INTRAVENOUS
  Filled 2021-11-09: qty 1

## 2021-11-09 NOTE — ED Notes (Signed)
Patient bed and brief changed
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
## 2021-11-09 NOTE — ED Provider Notes (Signed)
Physical Exam  BP 130/68    Pulse 72    Temp 98 F (36.7 C) (Oral)    Resp 16    SpO2 100%   Physical Exam  Procedures  .Sedation  Date/Time: 11/09/2021 2:28 PM Performed by: Jacalyn Lefevre, MD Authorized by: Jacalyn Lefevre, MD   Consent:    Consent obtained:  Emergent situation Universal protocol:    Immediately prior to procedure, a time out was called: yes     Patient identity confirmed:  Arm band Pre-sedation assessment:    Time since last food or drink:  5   ASA classification: class 3 - patient with severe systemic disease     Mallampati score:  II - soft palate, uvula, fauces visible   Pre-sedation assessments completed and reviewed: airway patency, cardiovascular function, hydration status, mental status, nausea/vomiting, pain level, respiratory function and temperature   Procedure details (see MAR for exact dosages):    Preoxygenation:  Room air   Sedation:  Etomidate and midazolam   Intended level of sedation: deep   Intra-procedure monitoring:  Blood pressure monitoring, cardiac monitor, continuous pulse oximetry, frequent LOC assessments and frequent vital sign checks   Intra-procedure events: none     Total Provider sedation time (minutes):  30 Post-procedure details:    Post-sedation assessment completed:  11/09/2021 12:00 PM   Attendance: Constant attendance by certified staff until patient recovered     Recovery: Patient returned to pre-procedure baseline     Post-sedation assessments completed and reviewed: airway patency, cardiovascular function, hydration status, mental status, nausea/vomiting, pain level, respiratory function and temperature     Patient is stable for discharge or admission: yes     Procedure completion:  Tolerated well, no immediate complications .Sedation  Date/Time: 11/09/2021 2:31 PM Performed by: Jacalyn Lefevre, MD Authorized by: Jacalyn Lefevre, MD   Consent:    Consent obtained:  Emergent situation Universal protocol:     Immediately prior to procedure, a time out was called: yes     Patient identity confirmed:  Arm band Pre-sedation assessment:    Time since last food or drink:  7   ASA classification: class 3 - patient with severe systemic disease     Mallampati score:  II - soft palate, uvula, fauces visible   Pre-sedation assessments completed and reviewed: airway patency, cardiovascular function, hydration status, mental status, nausea/vomiting, pain level, respiratory function and temperature   Immediate pre-procedure details:    Reviewed: vital signs, relevant labs/tests and NPO status     Verified: bag valve mask available, emergency equipment available, intubation equipment available, IV patency confirmed, oxygen available and reversal medications available   Procedure details (see MAR for exact dosages):    Preoxygenation:  Room air   Sedation:  Etomidate and midazolam   Intended level of sedation: deep   Intra-procedure monitoring:  Blood pressure monitoring, cardiac monitor, continuous pulse oximetry, frequent LOC assessments and frequent vital sign checks   Intra-procedure events: none     Total Provider sedation time (minutes):  30 Post-procedure details:    Post-sedation assessment completed:  11/09/2021 2:32 PM   Attendance: Constant attendance by certified staff until patient recovered     Recovery: Patient returned to pre-procedure baseline     Post-sedation assessments completed and reviewed: airway patency, cardiovascular function, hydration status, mental status, nausea/vomiting, pain level, respiratory function and temperature     Patient is stable for discharge or admission: yes     Procedure completion:  Tolerated well, no immediate complications  ED Course / MDM    Medical Decision Making Amount and/or Complexity of Data Reviewed Radiology: ordered.  Risk OTC drugs. Prescription drug management.   Pt signed out from Dr. Preston Fleeting at shift change.  He was unable to examine pt's  eye and CT scans were pending.  Pt has dementia with a lot of agitation.  We gave him 0.5 mg ativan IM initially to see if he could stay still for CT scan.  CT unable to be done.  Pt then given 2 mg haldol IM and 1 mg ativan IM.  PT still unable to hold still.  Due to the need to obtain CT scans and eval his eye, I did a sedation on patient.     CTs showed:    IMPRESSION:  1. No acute intracranial pathology. Small-vessel white matter  disease.  2. Examination of the facial bones is generally somewhat limited by  motion artifact. Within this limitation, there are minimally  depressed blowout type fracture fragments of the floor of the left  orbit, which are not well characterized for size and exact  displacement due to motion limitations.  3. Probable additional nondisplaced fracture of the lateral wall of  the left maxillary sinus  4. There are small foci of extraconal air within the inferior left  orbit and inferior palpebra. The conus and globe are grossly intact  by noncontrast CT.  5. No fracture or subluxation of the cervical spine.      Pt would not follow directions to assess EOM, so I spoke with Dr. Zenaida Niece (ophthalmology) who came to see pt in the ED. We had to re-sedate him for that exam.  No signs of entrapment on her exam.  She recommends erythromycin eye ointment and no nose blowing for 72 hrs.  She will follow up with pt in 1-2 weeks.     CRITICAL CARE Performed by: Jacalyn Lefevre   Total critical care time: 60 minutes  Critical care time was exclusive of separately billable procedures and treating other patients.  Critical care was necessary to treat or prevent imminent or life-threatening deterioration.  Critical care was time spent personally by me on the following activities: development of treatment plan with patient and/or surrogate as well as nursing, discussions with consultants, evaluation of patient's response to treatment, examination of patient, obtaining  history from patient or surrogate, ordering and performing treatments and interventions, ordering and review of laboratory studies, ordering and review of radiographic studies, pulse oximetry and re-evaluation of patient's condition.     I tried to call his wife twice with no answer.  I did get ahold of pt's son to let him know what was going on.       Jacalyn Lefevre, MD 11/09/21 1433
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
## 2021-11-09 NOTE — Discharge Instructions (Addendum)
-  Return to Dr. Richard Miu clinic in 1-2 weeks for assessment of extraocular motility, double vision, alignment and enophthalmos.   -Ice over eye. Do not blow nose for 72 hours. Can use Afrin for an nasal congestion.   Proptosis OS -Will improve as swelling decreases   Subconjunctival hemorrhage OS -Will spontaneously resolve -Does have conjunctival exposure **recommend erythromycin ointment TID in left eye** until swelling goes down and lids can cover conjunctiva.   Periorbital hematoma -Recommend ice.   Have patient follow up at [facility] in 1-2 weeks. Please call our office to set an appointment [phone].

## 2021-11-09 NOTE — ED Notes (Signed)
Patient to CT to attempt scans.

## 2021-11-10 DIAGNOSIS — S0232XS Fracture of orbital floor, left side, sequela: Secondary | ICD-10-CM | POA: Diagnosis not present

## 2021-11-10 DIAGNOSIS — H1132 Conjunctival hemorrhage, left eye: Secondary | ICD-10-CM | POA: Diagnosis not present

## 2021-11-11 DIAGNOSIS — F02818 Dementia in other diseases classified elsewhere, unspecified severity, with other behavioral disturbance: Secondary | ICD-10-CM | POA: Diagnosis not present

## 2021-11-18 DIAGNOSIS — F02818 Dementia in other diseases classified elsewhere, unspecified severity, with other behavioral disturbance: Secondary | ICD-10-CM | POA: Diagnosis not present

## 2021-11-25 DIAGNOSIS — F02818 Dementia in other diseases classified elsewhere, unspecified severity, with other behavioral disturbance: Secondary | ICD-10-CM | POA: Diagnosis not present

## 2021-11-28 DIAGNOSIS — H1132 Conjunctival hemorrhage, left eye: Secondary | ICD-10-CM | POA: Diagnosis not present

## 2021-11-28 DIAGNOSIS — S0232XD Fracture of orbital floor, left side, subsequent encounter for fracture with routine healing: Secondary | ICD-10-CM | POA: Diagnosis not present

## 2021-12-02 DIAGNOSIS — R1310 Dysphagia, unspecified: Secondary | ICD-10-CM | POA: Diagnosis not present

## 2021-12-02 DIAGNOSIS — F02818 Dementia in other diseases classified elsewhere, unspecified severity, with other behavioral disturbance: Secondary | ICD-10-CM | POA: Diagnosis not present

## 2021-12-09 DIAGNOSIS — R1310 Dysphagia, unspecified: Secondary | ICD-10-CM | POA: Diagnosis not present

## 2021-12-09 DIAGNOSIS — F02818 Dementia in other diseases classified elsewhere, unspecified severity, with other behavioral disturbance: Secondary | ICD-10-CM | POA: Diagnosis not present

## 2021-12-13 ENCOUNTER — Other Ambulatory Visit: Payer: Self-pay

## 2021-12-13 ENCOUNTER — Emergency Department (HOSPITAL_COMMUNITY): Payer: Medicare Other

## 2021-12-13 ENCOUNTER — Inpatient Hospital Stay (HOSPITAL_COMMUNITY)
Admission: EM | Admit: 2021-12-13 | Discharge: 2021-12-19 | DRG: 603 | Disposition: A | Discharge status: Skilled Nursing Facility | Payer: Medicare Other | Source: Skilled Nursing Facility | Attending: Internal Medicine | Admitting: Internal Medicine

## 2021-12-13 DIAGNOSIS — A419 Sepsis, unspecified organism: Secondary | ICD-10-CM | POA: Diagnosis not present

## 2021-12-13 DIAGNOSIS — R000 Tachycardia, unspecified: Secondary | ICD-10-CM | POA: Diagnosis not present

## 2021-12-13 DIAGNOSIS — W57XXXA Bitten or stung by nonvenomous insect and other nonvenomous arthropods, initial encounter: Secondary | ICD-10-CM | POA: Diagnosis present

## 2021-12-13 DIAGNOSIS — Z7189 Other specified counseling: Secondary | ICD-10-CM | POA: Diagnosis not present

## 2021-12-13 DIAGNOSIS — Z20822 Contact with and (suspected) exposure to covid-19: Secondary | ICD-10-CM | POA: Diagnosis present

## 2021-12-13 DIAGNOSIS — E785 Hyperlipidemia, unspecified: Secondary | ICD-10-CM | POA: Diagnosis not present

## 2021-12-13 DIAGNOSIS — N400 Enlarged prostate without lower urinary tract symptoms: Secondary | ICD-10-CM | POA: Diagnosis present

## 2021-12-13 DIAGNOSIS — F05 Delirium due to known physiological condition: Secondary | ICD-10-CM | POA: Diagnosis present

## 2021-12-13 DIAGNOSIS — Z888 Allergy status to other drugs, medicaments and biological substances status: Secondary | ICD-10-CM | POA: Diagnosis not present

## 2021-12-13 DIAGNOSIS — I10 Essential (primary) hypertension: Secondary | ICD-10-CM | POA: Diagnosis not present

## 2021-12-13 DIAGNOSIS — Z743 Need for continuous supervision: Secondary | ICD-10-CM | POA: Diagnosis not present

## 2021-12-13 DIAGNOSIS — Z8249 Family history of ischemic heart disease and other diseases of the circulatory system: Secondary | ICD-10-CM | POA: Diagnosis not present

## 2021-12-13 DIAGNOSIS — L03114 Cellulitis of left upper limb: Secondary | ICD-10-CM | POA: Diagnosis not present

## 2021-12-13 DIAGNOSIS — G3183 Dementia with Lewy bodies: Secondary | ICD-10-CM | POA: Diagnosis present

## 2021-12-13 DIAGNOSIS — Z515 Encounter for palliative care: Secondary | ICD-10-CM | POA: Diagnosis not present

## 2021-12-13 DIAGNOSIS — Z79899 Other long term (current) drug therapy: Secondary | ICD-10-CM | POA: Diagnosis not present

## 2021-12-13 DIAGNOSIS — Z66 Do not resuscitate: Secondary | ICD-10-CM | POA: Diagnosis present

## 2021-12-13 DIAGNOSIS — Z7401 Bed confinement status: Secondary | ICD-10-CM | POA: Diagnosis not present

## 2021-12-13 DIAGNOSIS — Z0389 Encounter for observation for other suspected diseases and conditions ruled out: Secondary | ICD-10-CM | POA: Diagnosis not present

## 2021-12-13 DIAGNOSIS — F03918 Unspecified dementia, unspecified severity, with other behavioral disturbance: Secondary | ICD-10-CM | POA: Diagnosis not present

## 2021-12-13 DIAGNOSIS — N3000 Acute cystitis without hematuria: Secondary | ICD-10-CM

## 2021-12-13 DIAGNOSIS — R531 Weakness: Secondary | ICD-10-CM | POA: Diagnosis not present

## 2021-12-13 DIAGNOSIS — R509 Fever, unspecified: Secondary | ICD-10-CM | POA: Diagnosis not present

## 2021-12-13 DIAGNOSIS — N390 Urinary tract infection, site not specified: Secondary | ICD-10-CM | POA: Diagnosis present

## 2021-12-13 DIAGNOSIS — F02818 Dementia in other diseases classified elsewhere, unspecified severity, with other behavioral disturbance: Secondary | ICD-10-CM | POA: Diagnosis not present

## 2021-12-13 DIAGNOSIS — B9562 Methicillin resistant Staphylococcus aureus infection as the cause of diseases classified elsewhere: Secondary | ICD-10-CM | POA: Diagnosis present

## 2021-12-13 DIAGNOSIS — F02811 Dementia in other diseases classified elsewhere, unspecified severity, with agitation: Secondary | ICD-10-CM | POA: Diagnosis not present

## 2021-12-13 DIAGNOSIS — F0280 Dementia in other diseases classified elsewhere without behavioral disturbance: Secondary | ICD-10-CM | POA: Diagnosis not present

## 2021-12-13 DIAGNOSIS — L0390 Cellulitis, unspecified: Principal | ICD-10-CM

## 2021-12-13 DIAGNOSIS — L02414 Cutaneous abscess of left upper limb: Secondary | ICD-10-CM | POA: Diagnosis not present

## 2021-12-13 DIAGNOSIS — S60569A Insect bite (nonvenomous) of unspecified hand, initial encounter: Secondary | ICD-10-CM | POA: Diagnosis not present

## 2021-12-13 LAB — URINALYSIS, ROUTINE W REFLEX MICROSCOPIC
Glucose, UA: NEGATIVE mg/dL
Ketones, ur: 15 mg/dL — AB
Nitrite: POSITIVE — AB
Protein, ur: 100 mg/dL — AB
Specific Gravity, Urine: 1.02 (ref 1.005–1.030)
pH: 6.5 (ref 5.0–8.0)

## 2021-12-13 LAB — COMPREHENSIVE METABOLIC PANEL
ALT: 26 U/L (ref 0–44)
AST: 28 U/L (ref 15–41)
Albumin: 3.3 g/dL — ABNORMAL LOW (ref 3.5–5.0)
Alkaline Phosphatase: 79 U/L (ref 38–126)
Anion gap: 10 (ref 5–15)
BUN: 23 mg/dL (ref 8–23)
CO2: 25 mmol/L (ref 22–32)
Calcium: 8.9 mg/dL (ref 8.9–10.3)
Chloride: 100 mmol/L (ref 98–111)
Creatinine, Ser: 0.62 mg/dL (ref 0.61–1.24)
GFR, Estimated: >60 mL/min (ref >60)
Glucose, Bld: 104 mg/dL — ABNORMAL HIGH (ref 70–99)
Potassium: 3.6 mmol/L (ref 3.5–5.1)
Sodium: 135 mmol/L (ref 135–145)
Total Bilirubin: 1.2 mg/dL (ref 0.3–1.2)
Total Protein: 6.9 g/dL (ref 6.5–8.1)

## 2021-12-13 LAB — URINALYSIS, MICROSCOPIC (REFLEX): Squamous Epithelial / HPF: NONE SEEN (ref 0–5)

## 2021-12-13 LAB — RESP PANEL BY RT-PCR (FLU A&B, COVID) ARPGX2
Influenza A by PCR: NEGATIVE
Influenza B by PCR: NEGATIVE
SARS Coronavirus 2 by RT PCR: NEGATIVE

## 2021-12-13 LAB — CBC WITH DIFFERENTIAL/PLATELET
Abs Immature Granulocytes: 0.05 10*3/uL (ref 0.00–0.07)
Basophils Absolute: 0.1 10*3/uL (ref 0.0–0.1)
Basophils Relative: 1 %
Eosinophils Absolute: 0 10*3/uL (ref 0.0–0.5)
Eosinophils Relative: 0 %
HCT: 37.5 % — ABNORMAL LOW (ref 39.0–52.0)
Hemoglobin: 12.7 g/dL — ABNORMAL LOW (ref 13.0–17.0)
Immature Granulocytes: 1 %
Lymphocytes Relative: 12 %
Lymphs Abs: 1.3 10*3/uL (ref 0.7–4.0)
MCH: 32.2 pg (ref 26.0–34.0)
MCHC: 33.9 g/dL (ref 30.0–36.0)
MCV: 95.2 fL (ref 80.0–100.0)
Monocytes Absolute: 1.4 10*3/uL — ABNORMAL HIGH (ref 0.1–1.0)
Monocytes Relative: 12 %
Neutro Abs: 8.3 10*3/uL — ABNORMAL HIGH (ref 1.7–7.7)
Neutrophils Relative %: 74 %
Platelets: 199 10*3/uL (ref 150–400)
RBC: 3.94 MIL/uL — ABNORMAL LOW (ref 4.22–5.81)
RDW: 12.4 % (ref 11.5–15.5)
WBC: 11.1 10*3/uL — ABNORMAL HIGH (ref 4.0–10.5)
nRBC: 0 % (ref 0.0–0.2)

## 2021-12-13 LAB — PROTIME-INR
INR: 1.2 (ref 0.8–1.2)
Prothrombin Time: 15.1 seconds (ref 11.4–15.2)

## 2021-12-13 LAB — LACTIC ACID, PLASMA
Lactic Acid, Venous: 0.8 mmol/L (ref 0.5–1.9)
Lactic Acid, Venous: 1.2 mmol/L (ref 0.5–1.9)

## 2021-12-13 LAB — APTT: aPTT: 42 seconds — ABNORMAL HIGH (ref 24–36)

## 2021-12-13 MED ORDER — HALOPERIDOL LACTATE 5 MG/ML IJ SOLN
1.0000 mg | Freq: Once | INTRAMUSCULAR | Status: AC
  Administered 2021-12-13: 1 mg via INTRAVENOUS
  Filled 2021-12-13: qty 1

## 2021-12-13 MED ORDER — ACETAMINOPHEN 650 MG RE SUPP: 650.0000 mg | Freq: Four times a day (QID) | RECTAL | Status: DC | PRN

## 2021-12-13 MED ORDER — ACETAMINOPHEN 325 MG PO TABS: 650.0000 mg | ORAL_TABLET | Freq: Four times a day (QID) | ORAL | Status: DC | PRN

## 2021-12-13 MED ORDER — VANCOMYCIN HCL IN DEXTROSE 1-5 GM/200ML-% IV SOLN
1000.0000 mg | Freq: Once | INTRAVENOUS | Status: AC
  Administered 2021-12-14: 1000 mg via INTRAVENOUS
  Filled 2021-12-13: qty 200

## 2021-12-13 MED ORDER — HYDROCODONE-ACETAMINOPHEN 5-325 MG PO TABS: 1.0000 | ORAL_TABLET | ORAL | Status: DC | PRN

## 2021-12-13 MED ORDER — SODIUM CHLORIDE 0.9 % IV SOLN
2.0000 g | INTRAVENOUS | Status: DC
Start: 2021-12-13 — End: 2021-12-15
  Administered 2021-12-13 – 2021-12-14 (×2): 2 g via INTRAVENOUS
  Filled 2021-12-13 (×2): qty 20

## 2021-12-13 MED ORDER — SODIUM CHLORIDE 0.9 % IV SOLN
75.0000 mL/h | INTRAVENOUS | Status: AC
  Administered 2021-12-14: 75 mL/h via INTRAVENOUS

## 2021-12-13 MED ORDER — HALOPERIDOL LACTATE 5 MG/ML IJ SOLN
2.0000 mg | Freq: Once | INTRAMUSCULAR | Status: AC
  Administered 2021-12-13: 2 mg via INTRAVENOUS
  Filled 2021-12-13: qty 1

## 2021-12-13 MED ORDER — LACTATED RINGERS IV SOLN: INTRAVENOUS | Status: AC

## 2021-12-13 NOTE — H&P (Signed)
? ? ?Walter [name] VQM:[id number] DOB: Oct 30, 1950 DOA: 12/13/2021 ? ? ?  ?PCP: Alfredia Ferguson, PA-C   ?Outpatient Specialists:  ?   ?NEurology  Dr. Carlynn Herald ?   ?Patient arrived to ER on 12/13/21 at 1716 ?Referred by Attending Lorre Nick, MD ? ? ?Patient coming from:   ?  ?From facility [facility] ? ?Chief Complaint:   ?Chief Complaint  ?Patient presents with  ? Insect Bite  ? Fever  ? ? ?HPI: ?Walter [name] is a 71 y.o. male with medical history significant of dementia BPH hypertension, Lewy body dementia with behavioral disturbance  ?  ? ?Presented with fever and possible infection of the wound  ? ?Patient comes in with confusion and fever ?He has dementia at baseline and does have significant agitation. ?Arrives from [facility] ?Where they thought that he may have had a spider bite on 15 March with a red wound inflamed he has been treated with doxycycline ?Febrile up to 100.4 ?   ?At baseline able to walk ? ? Initial COVID TEST  ?NEGATIVE  ? ?Lab Results  ?Component Value Date  ? SARSCOV2NAA NEGATIVE 12/13/2021  ? ?  ?Regarding pertinent Chronic problems:   ?   ? HTN on not on any meds ?   ?  Dementia - on Depakote and Exelon as well as Seroquel ?   ?While in ER: ?  ?UA showed UTI ?  ?Ordered ?Left elbow film no acute no foreign body ?   ?CXR -  NON acute ?  ? ?Following Medications were ordered in ER: ?Medications  ?lactated ringers infusion ( Intravenous New Bag/Given 12/13/21 1949)  ?vancomycin (VANCOCIN) IVPB 1000 mg/200 mL premix (has no administration in time range)  ?cefTRIAXone (ROCEPHIN) 2 g in sodium chloride 0.9 % 100 mL IVPB (has no administration in time range)  ?haloperidol lactate (HALDOL) injection 1 mg (1 mg Intravenous Given 12/13/21 1817)  ?haloperidol lactate (HALDOL) injection 1 mg (1 mg Intravenous Given 12/13/21 1858)  ?haloperidol lactate (HALDOL) injection 2 mg (2 mg Intravenous Given 12/13/21 2139)  ?  ?________________  ?  ?ED Triage Vitals  ?Enc Vitals Group  ?   BP 12/13/21 1751  128/70  ?   Pulse Rate 12/13/21 1751 92  ?   Resp 12/13/21 1751 18  ?   Temp 12/13/21 1751 97.8 ?F (36.6 ?C)  ?   Temp Source 12/13/21 1751 Rectal  ?   SpO2 12/13/21 1729 99 %  ?   Weight --   ?   Height --   ?   Head Circumference --   ?   Peak Flow --   ?   Pain Score --   ?   Pain Loc --   ?   Pain Edu? --   ?   Excl. in GC? --   ?DTOI(71)@    ? _________________________________________ ?Significant initial  Findings: ?Abnormal Labs Reviewed  ?COMPREHENSIVE METABOLIC PANEL - Abnormal; Notable for the following components:  ?    Result Value  ? Glucose, Bld 104 (*)   ? Albumin 3.3 (*)   ? All other components within normal limits  ?CBC WITH DIFFERENTIAL/PLATELET - Abnormal; Notable for the following components:  ? WBC 11.1 (*)   ? RBC 3.94 (*)   ? Hemoglobin 12.7 (*)   ? HCT 37.5 (*)   ? Neutro Abs 8.3 (*)   ? Monocytes Absolute 1.4 (*)   ? All other components within normal limits  ?APTT - Abnormal;  Notable for the following components:  ? aPTT 42 (*)   ? All other components within normal limits  ?URINALYSIS, ROUTINE W REFLEX MICROSCOPIC - Abnormal; Notable for the following components:  ? Hgb urine dipstick TRACE (*)   ? Bilirubin Urine SMALL (*)   ? Ketones, ur 15 (*)   ? Protein, ur 100 (*)   ? Nitrite POSITIVE (*)   ? Leukocytes,Ua TRACE (*)   ? All other components within normal limits  ?URINALYSIS, MICROSCOPIC (REFLEX) - Abnormal; Notable for the following components:  ? Bacteria, UA MANY (*)   ? All other components within normal limits  ?  ?ECG: Ordered ?Personally reviewed by me showing: ?HR : 85 ?Rhythm:  SR Left anterior fascicular block ?Abnormal R-wave progression, late transition ?ST elevation, consider inferior injury ?QTC 431 ?  ?The recent clinical data is shown below. ?Vitals:  ? 12/13/21 1729 12/13/21 1751 12/13/21 2000 12/13/21 2100  ?BP:  128/70 120/71 (!) 123/92  ?Pulse:  92 80 64  ?Resp:  18 16 14   ?Temp:  97.8 ?F (36.6 ?C)    ?TempSrc:  Rectal    ?SpO2: 99% 99% 97% 96%  ?  ?WBC ? ?    ?Component Value Date/Time  ? WBC 11.1 (H) 12/13/2021 1911  ? LYMPHSABS 1.3 12/13/2021 1911  ? LYMPHSABS 1.6 07/30/2021 1625  ? MONOABS 1.4 (H) 12/13/2021 1911  ? EOSABS 0.0 12/13/2021 1911  ? EOSABS 0.1 07/30/2021 1625  ? BASOSABS 0.1 12/13/2021 1911  ? BASOSABS 0.0 07/30/2021 1625  ?  ?Lactic Acid, Venous ?   ?Component Value Date/Time  ? LATICACIDVEN 1.2 12/13/2021 1910  ?  ? UA  evidence of UTI   ?  ?Urine analysis: ?   ?Component Value Date/Time  ? [place] YELLOW 12/13/2021 2016  ? APPEARANCEUR CLEAR 12/13/2021 2016  ? LABSPEC 1.020 12/13/2021 2016  ? PHURINE 6.5 12/13/2021 2016  ? [place] NEGATIVE 12/13/2021 2016  ? HGBUR TRACE (A) 12/13/2021 2016  ? BILIRUBINUR SMALL (A) 12/13/2021 2016  ? KETONESUR 15 (A) 12/13/2021 2016  ? PROTEINUR 100 (A) 12/13/2021 2016  ? NITRITE POSITIVE (A) 12/13/2021 2016  ? LEUKOCYTESUR TRACE (A) 12/13/2021 2016  ? ? ?Results for orders placed or performed during the hospital encounter of 12/13/21  ?Resp Panel by RT-PCR (Flu A&B, Covid) Nasopharyngeal Swab     Status: None  ? Collection Time: 12/13/21  7:11 PM  ? Specimen: Nasopharyngeal Swab; Nasopharyngeal(NP) swabs in vial transport medium  ?Result Value Ref Range Status  ? SARS Coronavirus 2 by RT PCR NEGATIVE NEGATIVE Final  ?      ? Influenza A by PCR NEGATIVE NEGATIVE Final  ? Influenza B by PCR NEGATIVE NEGATIVE Final  ?      ? ? ______________________________________ ?Hospitalist was called for admission for  UTI, cellulitis ? ?The following Work up has been ordered so far: ? ?Orders Placed This Encounter  ?Procedures  ? Blood Culture (routine x 2)  ? Urine Culture  ? Resp Panel by RT-PCR (Flu A&B, Covid) Nasopharyngeal Swab  ? DG Chest Port 1 View  ? DG Elbow Complete Left  ? Lactic acid, plasma  ? Comprehensive metabolic panel  ? CBC WITH DIFFERENTIAL  ? Protime-INR  ? APTT  ? Urinalysis, Routine w reflex microscopic  ? Urinalysis, Microscopic (reflex)  ? Diet NPO time specified  ? Cardiac monitoring  ? Document  height and weight  ? Assess and Document Glasgow Coma Scale  ? Document vital signs within 1-hour of  fluid bolus completion.  Notify provider of abnormal vital signs despite fluid resuscitation.  ? Refer to Sidebar Report: Sepsis Bundle ED/IP  ? Notify provider for difficulties obtaining IV access  ? Initiate Carrier Fluid Protocol  ? Safety Observation  ? In and Out Cath  ? Consult to hospitalist  ? Pulse oximetry, continuous  ? ED EKG 12-Lead  ? Insert peripheral IV X 1  ?  ? ?OTHER Significant initial  Findings: ? ?labs showing: ? ?  ?Recent Labs  ?Lab 12/13/21 ?1911  ?NA 135  ?K 3.6  ?CO2 25  ?GLUCOSE 104*  ?BUN 23  ?CREATININE 0.62  ?CALCIUM 8.9  ? ? ?Cr    stable,    ?Lab Results  ?Component Value Date  ? CREATININE 0.62 12/13/2021  ? CREATININE 0.92 07/30/2021  ? CREATININE 1.01 07/08/2020  ? ? ?Recent Labs  ?Lab 12/13/21 ?1911  ?AST 28  ?ALT 26  ?ALKPHOS 79  ?BILITOT 1.2  ?PROT 6.9  ?ALBUMIN 3.3*  ? ?Lab Results  ?Component Value Date  ? CALCIUM 8.9 12/13/2021  ? ?    ?   ?Plt: ?Lab Results  ?Component Value Date  ? PLT 199 12/13/2021  ?  ?COVID-19 Labs ? ?No results for input(s): DDIMER, FERRITIN, LDH, CRP in the last 72 hours. ? ?Lab Results  ?Component Value Date  ? [place] NEGATIVE 12/13/2021  ? ?  ?Recent Labs  ?Lab 12/13/21 ?1911  ?WBC 11.1*  ?NEUTROABS 8.3*  ?HGB 12.7*  ?HCT 37.5*  ?MCV 95.2  ?PLT 199  ? ? ?HG/HCT  stable,    ?   ?Component Value Date/Time  ? HGB 12.7 (L) 12/13/2021 1911  ? HGB 14.1 07/30/2021 1625  ? HCT 37.5 (L) 12/13/2021 1911  ? HCT 40.4 07/30/2021 1625  ? MCV 95.2 12/13/2021 1911  ? MCV 93 07/30/2021 1625  ? ?  ? ?Cardiac Panel (last 3 results) ?No results for input(s): CKTOTAL, CKMB, TROPONINI, RELINDX in the last 72 hours. ? .car ?BNP (last 3 results) ?No results for input(s): BNP in the last 8760 hours. ?   ?    ?Cultures: ?No results found for: SDES, [place], [place], REPTSTATUS ?  ?Radiological Exams on Admission: ?DG Elbow Complete Left ? ?Result Date:  12/13/2021 ?CLINICAL DATA:  Possible sepsis. Insect bite with redness and swelling. EXAM: LEFT ELBOW - COMPLETE 3+ VIEW COMPARISON:  None. FINDINGS: There is no evidence of fracture, dislocation, or joint effusion. Mild spurr

## 2021-12-13 NOTE — ED Notes (Signed)
Patient disoriented and swinging at staff when when staff attempt patient care. Patient attempting to get out of bed at this time. Medication administered. ?

## 2021-12-13 NOTE — Assessment & Plan Note (Signed)
-   treat with Rocephin         await results of urine culture and adjust antibiotic coverage as needed  

## 2021-12-13 NOTE — Assessment & Plan Note (Signed)
Continue home medications treat for sundowning as needed ?

## 2021-12-13 NOTE — ED Triage Notes (Signed)
Pt BIBA from [facility]. Pt had suspected spider bite on 3/15. Wound red, inflamed, w/open area. Pt has taken 2-4 doses of doxycycline.  ? ?Pt had tmep of 100.82f today. Was given 1g Tylenol PO. ? ?Hx dementia with tendency to wander. ?Baseline AXO ? ?BP: 130/76 ?HR: 88 ?SPO2: 99 RA ?CBG: 174 ?

## 2021-12-13 NOTE — Subjective & Objective (Signed)
Patient comes in with confusion and fever ?He has dementia at baseline and does have significant agitation. ?Arrives from [facility] ?Where they thought that he may have had a spider bite on 15 March with a red wound inflamed he has been treated with doxycycline ?Febrile up to 100.4 ?

## 2021-12-13 NOTE — ED Notes (Signed)
Patients soiled brief changed 

## 2021-12-13 NOTE — ED Provider Notes (Signed)
?[facility]-EMERGENCY DEPT ?Provider Note ? ? ?CSN: [id number] ?Arrival date & time: 12/13/21  1716 ? ?  ? ?History ? ?Chief Complaint  ?Patient presents with  ? Insect Bite  ? Fever  ? ? ?Walter [name] is a 71 y.o. male. ? ?71 year old male with history of dementia presents due to infected wound at his left elbow.  Questionable spider bite.  Was paced on doxycycline and has had a few doses without improvement.  Had temperature to 100.4.  Medicate with Tylenol prior to arrival.  Presents via EMS ? ? ?  ? ?Home Medications ?Prior to Admission medications   ?Medication Sig Start Date End Date Taking? Authorizing Provider  ?acetaminophen (TYLENOL) 500 MG tablet Take 500 mg by mouth every 6 (six) hours as needed for mild pain.    [provider]  ?alum & mag hydroxide-simeth (MYLANTA) 200-200-20 MG/5ML suspension Take 30 mLs by mouth every 6 (six) hours as needed for indigestion or heartburn.    [provider]  ?divalproex (DEPAKOTE SPRINKLE) 125 MG capsule Take 125 mg by mouth daily. At 2 PM 10/24/21   [provider]  ?erythromycin ophthalmic ointment Place a 1/2 inch ribbon of ointment into the lower eyelid every 4 hours. 11/09/21   Jacalyn Lefevre, MD  ?guaiFENesin (ROBITUSSIN) 100 MG/5ML liquid Take 10 mLs by mouth every 6 (six) hours as needed for cough or to loosen phlegm.    [provider]  ?HYDROcodone-acetaminophen (NORCO/VICODIN) 5-325 MG tablet Take 1 tablet by mouth every 4 (four) hours as needed. 11/09/21   Jacalyn Lefevre, MD  ?loperamide (IMODIUM) 2 MG capsule Take 2 mg by mouth daily as needed for diarrhea or loose stools.    [provider]  ?magnesium hydroxide (MILK OF MAGNESIA) 400 MG/5ML suspension Take 30 mLs by mouth daily as needed for mild constipation.    [provider]  ?Menthol-Zinc Oxide 0.44-20.625 % OINT Apply 1 application topically 3 (three) times daily. Each shift and any incontinence episode. 10/06/21   [provider]  ?oxymetazoline (AFRIN NASAL SPRAY) 0.05 % nasal spray Place 1 spray into both nostrils 2 (two) times daily. As needed for sinus congestion.  Do not use for more than 3 days. 11/09/21   Jacalyn Lefevre, MD  ?QUEtiapine (SEROQUEL) 25 MG tablet Take 12.5 mg by mouth at bedtime. 09/10/21 12/09/21  [provider]  ?rivastigmine (EXELON) 1.5 MG capsule Take 1.5 mg by mouth 2 (two) times daily with a meal. 08/29/20   [provider]  ?   ? ?Allergies    ?Antihistamines, chlorpheniramine-type   ? ?Review of Systems   ?Review of Systems  ?Unable to perform ROS: Dementia  ? ?Physical Exam ?Updated Vital Signs ?SpO2 99%  ?Physical Exam ?Vitals and nursing note reviewed.  ?Constitutional:   ?   General: He is not in acute distress. ?   Appearance: Normal appearance. He is well-developed. He is not toxic-appearing.  ?HENT:  ?   Head: Normocephalic and atraumatic.  ?Eyes:  ?   General: Lids are normal.  ?   Conjunctiva/sclera: Conjunctivae normal.  ?   Pupils: Pupils are equal, round, and reactive to light.  ?Neck:  ?   Thyroid: No thyroid mass.  ?   Trachea: No tracheal deviation.  ?Cardiovascular:  ?   Rate and Rhythm: Normal rate and regular rhythm.  ?   Heart sounds: Normal heart sounds. No murmur heard. ?  No gallop.  ?Pulmonary:  ?   Effort:  Pulmonary effort is normal. No respiratory distress.  ?   Breath sounds: Normal breath sounds. No stridor. No decreased breath sounds, wheezing, rhonchi or rales.  ?Abdominal:  ?   General: There is no distension.  ?   Palpations: Abdomen is soft.  ?   Tenderness: There is no abdominal tenderness. There is no rebound.  ?Musculoskeletal:     ?   General: No tenderness. Normal range of motion.  ?     Arms: ? ?   Cervical back: Normal range of motion and neck supple.  ?Skin: ?   General: Skin is warm and dry.  ?   Findings: No abrasion or rash.  ?Neurological:  ?   Mental Status: He is alert. He is disoriented.  ?   GCS: GCS eye subscore is 4. GCS verbal  subscore is 5. GCS motor subscore is 5.  ?   Cranial Nerves: No cranial nerve deficit.  ?   Sensory: No sensory deficit.  ?   Motor: No weakness.  ?Psychiatric:     ?   Attention and Perception: He is inattentive.     ?   Mood and Affect: Affect is blunt.  ? ? ?ED Results / Procedures / Treatments   ?Labs ?(all labs ordered are listed, but only abnormal results are displayed) ?Labs Reviewed  ?CULTURE, BLOOD (ROUTINE X 2)  ?CULTURE, BLOOD (ROUTINE X 2)  ?URINE CULTURE  ?RESP PANEL BY RT-PCR (FLU A&B, COVID) [id number]  ?LACTIC ACID, PLASMA  ?LACTIC ACID, PLASMA  ?COMPREHENSIVE METABOLIC PANEL  ?CBC WITH DIFFERENTIAL/PLATELET  ?PROTIME-INR  ?APTT  ?URINALYSIS, ROUTINE W REFLEX MICROSCOPIC  ? ? ?EKG ?EKG Interpretation ? ?Date/Time:  Saturday December 13 2021 17:33:04 EDT ?Ventricular Rate:  85 ?PR Interval:  180 ?QRS Duration: 110 ?QT Interval:  362 ?QTC Calculation: 431 ?R Axis:   -59 ?Text Interpretation: Sinus rhythm Left anterior fascicular block Abnormal R-wave progression, late transition ST elevation, consider inferior injury Confirmed by Lorre Nick ([id number]) on 12/13/2021 7:03:02 PM ? ?Radiology ?No results found. ? ?Procedures ?Procedures  ? ? ?Medications Ordered in ED ?Medications  ?lactated ringers infusion (has no administration in time range)  ? ? ?ED Course/ Medical Decision Making/ A&P ?  ?                        ?Medical Decision Making ?Amount and/or Complexity of Data Reviewed ?Labs: ordered. ?Radiology: ordered. ?ECG/medicine tests: ordered. ? ?Risk ?Prescription drug management. ? ? ?Patient presented with infection to left forearm along with being febrile.  Had been on oral antibiotics without improvement.  On exam it appears she has an infected olecranon bursa.  Does not appear to be in the joint itself.  Work-up.  Does show evidence of UTI.  Chest x-ray without infiltrate.  Patient will be placed on Rocephin for the UTI and vancomycin for the infected elbow.  Plan will be to admit to the hospital  service ? ? ? ? ? ? ? ?Final Clinical Impression(s) / ED Diagnoses ?Final diagnoses:  ?None  ? ? ?Rx / DC Orders ?ED Discharge Orders   ? ? None  ? ?  ? ? ?  ?Lorre Nick, MD ?12/13/21 2241 ? ?

## 2021-12-14 ENCOUNTER — Encounter (HOSPITAL_COMMUNITY): Payer: Self-pay | Admitting: Internal Medicine

## 2021-12-14 DIAGNOSIS — N3000 Acute cystitis without hematuria: Secondary | ICD-10-CM | POA: Diagnosis not present

## 2021-12-14 DIAGNOSIS — L0390 Cellulitis, unspecified: Secondary | ICD-10-CM | POA: Diagnosis present

## 2021-12-14 DIAGNOSIS — L03114 Cellulitis of left upper limb: Secondary | ICD-10-CM | POA: Diagnosis not present

## 2021-12-14 DIAGNOSIS — F03918 Unspecified dementia, unspecified severity, with other behavioral disturbance: Secondary | ICD-10-CM | POA: Diagnosis not present

## 2021-12-14 DIAGNOSIS — A419 Sepsis, unspecified organism: Secondary | ICD-10-CM | POA: Diagnosis present

## 2021-12-14 LAB — COMPREHENSIVE METABOLIC PANEL
ALT: 23 U/L (ref 0–44)
AST: 23 U/L (ref 15–41)
Albumin: 3 g/dL — ABNORMAL LOW (ref 3.5–5.0)
Alkaline Phosphatase: 66 U/L (ref 38–126)
Anion gap: 9 (ref 5–15)
BUN: 15 mg/dL (ref 8–23)
CO2: 24 mmol/L (ref 22–32)
Calcium: 8.3 mg/dL — ABNORMAL LOW (ref 8.9–10.3)
Chloride: 101 mmol/L (ref 98–111)
Creatinine, Ser: 0.6 mg/dL — ABNORMAL LOW (ref 0.61–1.24)
GFR, Estimated: >60 mL/min (ref >60)
Glucose, Bld: 102 mg/dL — ABNORMAL HIGH (ref 70–99)
Potassium: 3.5 mmol/L (ref 3.5–5.1)
Sodium: 134 mmol/L — ABNORMAL LOW (ref 135–145)
Total Bilirubin: 0.9 mg/dL (ref 0.3–1.2)
Total Protein: 6.4 g/dL — ABNORMAL LOW (ref 6.5–8.1)

## 2021-12-14 LAB — HEPATIC FUNCTION PANEL
ALT: 23 U/L (ref 0–44)
AST: 24 U/L (ref 15–41)
Albumin: 2.9 g/dL — ABNORMAL LOW (ref 3.5–5.0)
Alkaline Phosphatase: 66 U/L (ref 38–126)
Bilirubin, Direct: 0.3 mg/dL — ABNORMAL HIGH (ref 0.0–0.2)
Indirect Bilirubin: 0.7 mg/dL (ref 0.3–0.9)
Total Bilirubin: 1 mg/dL (ref 0.3–1.2)
Total Protein: 6.2 g/dL — ABNORMAL LOW (ref 6.5–8.1)

## 2021-12-14 LAB — CBC WITH DIFFERENTIAL/PLATELET
Abs Immature Granulocytes: 0.03 10*3/uL (ref 0.00–0.07)
Basophils Absolute: 0 10*3/uL (ref 0.0–0.1)
Basophils Relative: 0 %
Eosinophils Absolute: 0 10*3/uL (ref 0.0–0.5)
Eosinophils Relative: 0 %
HCT: 35.9 % — ABNORMAL LOW (ref 39.0–52.0)
Hemoglobin: 12.2 g/dL — ABNORMAL LOW (ref 13.0–17.0)
Immature Granulocytes: 0 %
Lymphocytes Relative: 7 %
Lymphs Abs: 0.6 10*3/uL — ABNORMAL LOW (ref 0.7–4.0)
MCH: 32.3 pg (ref 26.0–34.0)
MCHC: 34 g/dL (ref 30.0–36.0)
MCV: 95 fL (ref 80.0–100.0)
Monocytes Absolute: 1.1 10*3/uL — ABNORMAL HIGH (ref 0.1–1.0)
Monocytes Relative: 11 %
Neutro Abs: 8 10*3/uL — ABNORMAL HIGH (ref 1.7–7.7)
Neutrophils Relative %: 82 %
Platelets: 182 10*3/uL (ref 150–400)
RBC: 3.78 MIL/uL — ABNORMAL LOW (ref 4.22–5.81)
RDW: 12.3 % (ref 11.5–15.5)
WBC: 9.9 10*3/uL (ref 4.0–10.5)
nRBC: 0 % (ref 0.0–0.2)

## 2021-12-14 LAB — MAGNESIUM: Magnesium: 2 mg/dL (ref 1.7–2.4)

## 2021-12-14 LAB — CK: Total CK: 199 U/L (ref 49–397)

## 2021-12-14 LAB — MRSA NEXT GEN BY PCR, NASAL: MRSA by PCR Next Gen: DETECTED — AB

## 2021-12-14 LAB — PHOSPHORUS: Phosphorus: 2.8 mg/dL (ref 2.5–4.6)

## 2021-12-14 LAB — PROCALCITONIN: Procalcitonin: <0.1 ng/mL

## 2021-12-14 LAB — TSH: TSH: 0.438 u[IU]/mL (ref 0.350–4.500)

## 2021-12-14 MED ORDER — DIVALPROEX SODIUM 125 MG PO CSDR
250.0000 mg | DELAYED_RELEASE_CAPSULE | Freq: Two times a day (BID) | ORAL | Status: DC
  Administered 2021-12-14 – 2021-12-19 (×11): 250 mg via ORAL
  Filled 2021-12-14 (×12): qty 2

## 2021-12-14 MED ORDER — DIVALPROEX SODIUM 125 MG PO CSDR
125.0000 mg | DELAYED_RELEASE_CAPSULE | Freq: Every day | ORAL | Status: DC
  Filled 2021-12-14: qty 1

## 2021-12-14 MED ORDER — QUETIAPINE FUMARATE 25 MG PO TABS
12.5000 mg | ORAL_TABLET | Freq: Every day | ORAL | Status: DC
  Administered 2021-12-14 – 2021-12-16 (×3): 12.5 mg via ORAL
  Filled 2021-12-14 (×3): qty 1

## 2021-12-14 MED ORDER — HALOPERIDOL LACTATE 5 MG/ML IJ SOLN
1.0000 mg | Freq: Once | INTRAMUSCULAR | Status: AC
Start: 2021-12-14 — End: 2021-12-14
  Administered 2021-12-14: 1 mg via INTRAVENOUS
  Filled 2021-12-14: qty 1

## 2021-12-14 MED ORDER — HALOPERIDOL LACTATE 5 MG/ML IJ SOLN
2.0000 mg | Freq: Once | INTRAMUSCULAR | Status: AC
  Administered 2021-12-14: 2 mg via INTRAVENOUS
  Filled 2021-12-14: qty 1

## 2021-12-14 MED ORDER — RIVASTIGMINE TARTRATE 1.5 MG PO CAPS
1.5000 mg | ORAL_CAPSULE | Freq: Two times a day (BID) | ORAL | Status: DC
  Administered 2021-12-14 – 2021-12-19 (×11): 1.5 mg via ORAL
  Filled 2021-12-14 (×13): qty 1

## 2021-12-14 NOTE — Assessment & Plan Note (Signed)
-  admit per? cellulitis protocol will  ?    continue current antibiotic choice Rocephin ?     plain films showed no evidence of air  o evidence of osteomyelitis  no               foreign   Objects  ? obtain MRSA screening,  ?  obtain blood cultures      further antibiotic adjustment pending above results ? ?

## 2021-12-14 NOTE — Hospital Course (Signed)
71 y.o. male with medical history significant of dementia BPH hypertension, Lewy body dementia with behavioral disturbance  ?  ?  ?Presented with fever and possible infection of the wound  ?  ?Patient comes in with confusion and fever ?He has dementia at baseline and does have significant agitation. ?Arrives from [place] house ?Where they thought that he may have had a spider bite on 15 March with a red wound inflamed he has been treated with doxycycline ?Febrile up to 100.4 ?   ?

## 2021-12-14 NOTE — Evaluation (Signed)
Clinical/Bedside Swallow Evaluation ?Patient Details  ?Name: Walter [name] ?MRN: [id number] ?Date of Birth: 1951-03-07 ? ?Today's Date: 12/14/2021 ?Time: SLP Start Time (ACUTE ONLY): 1700 SLP Stop Time (ACUTE ONLY): 1715 ?SLP Time Calculation (min) (ACUTE ONLY): 15 min ? ?Past Medical History:  ?Past Medical History:  ?Diagnosis Date  ? Hyperlipidemia   ? ?Past Surgical History:  ?Past Surgical History:  ?Procedure Laterality Date  ? TONSILLECTOMY AND ADENOIDECTOMY  1960  ? ?HPI:  ?71 y.o. male admitted from SNF with confusion and fever. Dx cellulitis left elbow from possible spider bite and UTI. PMHX Lewy body dementia with behavioral disturbance, hypertension. Has been receiving Haldol for agitation. Per record review, 09/2020 office visit with [facility] neurology recommended SLP for LSVT Loud therapy. No indication of prior swallow evaluation. ?  ?Assessment / Plan / Recommendation  ?Clinical Impression ? Pt presents with functional oropharyngeal swallowing.  His ability to eat depends on mental status at any given moment. When he is alert and can focus his attention, he is able to hold a cup with assist, anticipate arrival of cup edge or spoon to lips and activate the normal swallow sequence.  At other times, he does not demonstrate adequate recognition or attention in order to eat. He may fluctuate between these two extremes multiple times during the course of a meal.  The biomechanics of his swallowing appear to be fine; there were no concerns for aspiration. Recommend keeping Walter [name] on a regular diet with thin liquids.  Give meds whole with puree or water.  Take his cues when feeding - when he is able to eat finger foods or hold his own cup with assist, it may help him with recognition and intiaiton of the swallow process. No further acute care SLP f/u is needed. D/W Rn. He may benefit from SLP eval at next level of care. ?SLP Visit Diagnosis: Dysphagia, unspecified (R13.10) ?   ?Aspiration Risk ?   unknown ?   ?Diet Recommendation   Regular solids, thin liquids ? ?Medication Administration: Whole meds with liquid  ?  ?Other  Recommendations Oral Care Recommendations: Oral care BID   ? ?Recommendations for follow up therapy are one component of a multi-disciplinary discharge planning process, led by the attending physician.  Recommendations may be updated based on patient status, additional functional criteria and insurance authorization. ? ?Follow up Recommendations No SLP follow up  ? ? ?  ? ? ?Swallow Study   ?General HPI: 71 y.o. male admitted from SNF with confusion and fever. Dx cellulitis left elbow from possible spider bite and UTI. PMHX Lewy body dementia with behavioral disturbance, hypertension. Has been receiving Haldol for agitation. Per record review, 09/2020 office visit with [facility] neurology recommended SLP for LSVT Loud therapy. ?Type of Study: Bedside Swallow Evaluation ?Previous Swallow Assessment: none per emr ?Diet Prior to this Study: Regular;Thin liquids ?Temperature Spikes Noted: No ?Respiratory Status: Room air ?History of Recent Intubation: No ?Behavior/Cognition: Alert;Confused ?Oral Cavity Assessment: Within Functional Limits ?Oral Care Completed by SLP: No ?Self-Feeding Abilities: Total assist ?Patient Positioning: Upright in bed ?Baseline Vocal Quality: Normal ?Volitional Cough: Cognitively unable to elicit ?Volitional Swallow: Unable to elicit  ?  ?Oral/Motor/Sensory Function Overall Oral Motor/Sensory Function: Other (comment) (symmetric at baseline)   ?Ice Chips Ice chips: Within functional limits   ?Thin Liquid Thin Liquid: Within functional limits  ?  ?Nectar Thick Nectar Thick Liquid: Not tested   ?Honey Thick Honey Thick Liquid: Not tested   ?Puree Puree: Within functional limits   ?  Solid ? ? ?  Solid: Within functional limits  ? ?  ? ?Walter [name] ?12/14/2021,5:38 PM ? ?Walter [name] L. Walter Broady, MA CCC/SLP ?Acute Rehabilitation Services ?Office number [phone] ?Pager  [phone] ? ? ?

## 2021-12-14 NOTE — Evaluation (Signed)
Occupational Therapy Evaluation ?Patient Details ?Name: Walter [name] ?MRN: [id number] ?DOB: 1951-09-14 ?Today's Date: 12/14/2021 ? ? ?History of Present Illness Patient is a 71 year old male who presented from LTC facility with possible insect bite and infection. PMH: dementia BPH hypertension, Lewy body dementia with behavioral disturbance  ? ?Clinical Impression ?  ?Patient is 71 year old male who is a long term care resident at [facility] memory care per chart review and nurse report. Patient has caregiver assistance 24/7 at [facility]. Patient was able to participate in self feeding tasks with mod A to prevent spillage. Patient was noted to have no new learning at this time impacting self feeding strategies that were attempted to be implemented.  Defer skilled OT services to next venue of care where patients true PLOF is known. Patient does not have any skilled acute OT needs at this time. OT signing off at this time.   ?   ? ?Recommendations for follow up therapy are one component of a multi-disciplinary discharge planning process, led by the attending physician.  Recommendations may be updated based on patient status, additional functional criteria and insurance authorization.  ? ?Follow Up Recommendations ? Other (comment) (patient to transition back to LTC SNF)  ?  ?Assistance Recommended at Discharge Frequent or constant Supervision/Assistance  ?Patient can return home with the following A lot of help with walking and/or transfers;A lot of help with bathing/dressing/bathroom;Assistance with cooking/housework;Direct supervision/assist for financial management;Assist for transportation;Help with stairs or ramp for entrance;Direct supervision/assist for medications management ? ?  ?Functional Status Assessment ? Patient has had a recent decline in their functional status and/or demonstrates limited ability to make significant improvements in function in a reasonable and predictable amount of time  ?Equipment  Recommendations ? None recommended by OT  ?  ?Recommendations for Other Services   ? ? ?  ?Precautions / Restrictions Precautions ?Precautions: Fall ?Restrictions ?Weight Bearing Restrictions: No  ? ?  ? ?Mobility Bed Mobility ?Overal bed mobility: Needs Assistance ?  ?  ?  ?  ?  ?  ?General bed mobility comments: patient was able to participate in long sitting in bed with min A with two people present for safety ?  ? ?Transfers ?  ?  ?  ?  ?  ?  ?  ?  ?  ?  ?  ? ?  ?Balance   ?  ?  ?  ?  ?  ?  ?  ?  ?  ?  ?  ?  ?  ?  ?  ?  ?  ?  ?   ? ?ADL either performed or assessed with clinical judgement  ? ?ADL Overall ADL's : At baseline ?  ?  ?  ?  ?  ?  ?  ?  ?  ?  ?  ?  ?  ?  ?  ?  ?  ?  ?  ?General ADL Comments: patient is a long term care resident from memory care SNF. patient was able to grasp cup with LUE and bring to mouth to take small sips. when patient was provided with cup of icecream patient attempted to drink cup of ice cream multiple times. when handed spoon patient took spoon flipped it around to handle side and brought it into mouth handle side first. patient was unable to follow cues to complete task. patietn was accepting of spoon to mouth with ability to take ice cream off spoon., patient was left with nurse  at this time to continue self feeding tasks. no family was in room to report PLOF. nurse was able to provide some PLOF from report that she received. all OT needs can be met in next venue of care.  ? ? ? ?Vision   ?Additional Comments: unable to assess with patients current cog status. patient was able to turn head towards voids and look for ice cream cup  ?   ?Perception   ?  ?Praxis   ?  ? ?Pertinent Vitals/Pain Pain Assessment ?Pain Assessment: No/denies pain  ? ? ? ?Hand Dominance Left ?  ?Extremity/Trunk Assessment Upper Extremity Assessment ?Upper Extremity Assessment: Difficult to assess due to impaired cognition (uanble to formally test MMT with patients cog status. patient was noted to have  poor coordination of RUE with attempts at holding cup to bring to mouth with patient having poor control over grasp strength and noted to reach past cup with dyskinesia.) ?  ?Lower Extremity Assessment ?Lower Extremity Assessment: Defer to PT evaluation ?  ?Cervical / Trunk Assessment ?Cervical / Trunk Assessment: Normal ?  ?Communication Communication ?Communication: No difficulties ?  ?Cognition Arousal/Alertness: Lethargic ?Behavior During Therapy: Flat affect, Impulsive ?Overall Cognitive Status: No family/caregiver present to determine baseline cognitive functioning ?  ?  ?  ?  ?  ?  ?  ?  ?  ?  ?  ?  ?  ?  ?  ?  ?General Comments: patient has dementia at baseline with ability to attend to name at times. patient was unable to orient spoon to attempt to use it. patient had increased difficulty with attempted participation in simple cues. ?  ?  ?General Comments    ? ?  ?Exercises   ?  ?Shoulder Instructions    ? ? ?Home Living Family/patient expects to be discharged to:: Skilled nursing facility ?  ?  ?  ?  ?  ?  ?  ?  ?  ?  ?  ?  ?  ?  ?  ?  ?  ?  ? ?  ?Prior Functioning/Environment Prior Level of Function : Needs assist ? Cognitive Assist : ADLs (cognitive);Mobility (cognitive) ?Mobility (Cognitive): Step by step cues ?ADLs (Cognitive): Step by step cues ?Physical Assist : ADLs (physical);Mobility (physical) ?  ?  ?Mobility Comments: nursing reported patient was a wanderer ?ADLs Comments: patient has physical and cog assist for ADLs. nurse reported nursing home reported he was feeding himself. ?  ? ?  ?  ?OT Problem List: Decreased activity tolerance;Impaired balance (sitting and/or standing);Decreased safety awareness;Decreased cognition ?  ?   ?OT Treatment/Interventions:    ?  ?OT Goals(Current goals can be found in the care plan section) Acute Rehab OT Goals ?OT Goal Formulation: All assessment and education complete, DC therapy  ?OT Frequency:   ?  ? ?Co-evaluation   ?  ?  ?  ?  ? ?  ?AM-PAC OT "6 Clicks"  Daily Activity     ?Outcome Measure Help from another person eating meals?: A Lot ?Help from another person taking care of personal grooming?: Total ?Help from another person toileting, which includes using toliet, bedpan, or urinal?: Total ?Help from another person bathing (including washing, rinsing, drying)?: Total ?Help from another person to put on and taking off regular upper body clothing?: Total ?Help from another person to put on and taking off regular lower body clothing?: Total ?6 Click Score: 7 ?  ?End of Session Nurse Communication: Other (comment) (patients nurse was present  in room during session) ? ?Activity Tolerance: Patient tolerated treatment well ?Patient left: in bed;with call bell/phone within reach;with bed alarm set;with nursing/sitter in room ? ?OT Visit Diagnosis: Unsteadiness on feet (R26.81);Muscle weakness (generalized) (M62.81);Other abnormalities of gait and mobility (R26.89);Feeding difficulties (R63.3)  ?              ?Time: 2284-0698 ?OT Time Calculation (min): 11 min ?Charges:  OT General Charges ?$OT Visit: 1 Visit ?OT Evaluation ?$OT Eval Low Complexity: 1 Low ? ?Ebelin Dillehay OTR/L, MS ?Acute Rehabilitation Department ?Office# [phone] ?Pager# [phone] ? ? ?[place] ?12/14/2021, 4:06 PM ?

## 2021-12-14 NOTE — Assessment & Plan Note (Signed)
-  SIRS criteria met with  elevated white blood cell count,    ?   ?Component Value Date/Time  ? WBC 11.1 (H) 12/13/2021 1911  ? LYMPHSABS 1.3 12/13/2021 1911  ? LYMPHSABS 1.6 07/30/2021 1625  ? ? ? fever  ?Today's Vitals  ? 12/13/21 1729 12/13/21 1751 12/13/21 2000 12/13/21 2100  ?BP:  128/70 120/71 (!) 123/92  ?Pulse:  92 80 64  ?Resp:  _0 ?Temp:  97.8 ?F (36.6 ?C)    ?TempSrc:  Rectal    ?SpO2: 99% 99% 97% 96%  ? ?   -Most likely source being  Urinary   ? ? - Obtain serial lactic acid and procalcitonin level. ? - Initiated IV antibiotics in ER: ?Antibiotics Given (last 72 hours)   ? None  ?  ? ? ?Will continue  on : Rocephin ? ? - await results of blood and urine culture ? - Rehydrate   ?Intravenous fluids were administered,  ?   ?12:00 AM ? ?

## 2021-12-14 NOTE — Progress Notes (Signed)
PT Cancellation Note ? ?Patient Details ?Name: Walter [name] ?MRN: [id number] ?DOB: 1951/09/01 ? ? ?Cancelled Treatment:    Reason Eval/Treat Not Completed: Other (comment) ?Per nursing staff pt is unable to follow any commands, apparent total care at [facility], not a rehab candidate d/t Lewy body dementia. Will sign off for PT at this time, if pt must have PT eval to return to [facility] please re-consult. Thank you. ? ? ?Json Koelzer ?12/14/2021, 10:37 AM ?

## 2021-12-14 NOTE — Assessment & Plan Note (Addendum)
-   Patient had spider bite on left elbow on March 15 ?-Treated with doxycycline ?-Developed erythema around left elbow wound, wound appears dry ?-Improving ?-Started on IV ceftriaxone ?-Antibiotics have been changed to vancomycin due to MRSA  In  urine ?

## 2021-12-14 NOTE — Assessment & Plan Note (Addendum)
-   Patient has been confused and intermittently agitated ?-Required Haldol last night, mittens in place ?-Agitation has improved after  the dose of Depakote was changed to 250 mg twice a day ?-Received Haldol last night for agitation ?-Continue Seroquel 12.5 mg at bedtime ?-Continue rivastigmine 1.5 mg p.o. twice daily ?

## 2021-12-14 NOTE — Progress Notes (Signed)
[facility] ? ?PROGRESS NOTE ? ?Walter [name] [id number] DOB: 1951/07/14 DOA: 12/13/2021 ?PCP: Mikey Kirschner, PA-C ? ?Brief hospital course ? ?71 y.o. male with medical history significant of dementia BPH hypertension, Lewy body dementia with behavioral disturbance  ?  ?  ?Presented with fever and possible infection of the wound  ?  ?Patient comes in with confusion and fever ?He has dementia at baseline and does have significant agitation. ?Arrives from [place] ?Where they thought that he may have had a spider bite on 15 March with a red wound inflamed he has been treated with doxycycline ?Febrile up to 100.4 ?    ? ? ? ?Subjective  ? ?This morning patient continues to be confused, required mittens last night and Haldol. ? ?  ? ?Assessment and Plan: ? ?* UTI (urinary tract infection) ?- Treated with Rocephin  ?-follow urine culture results ? ? ?Cellulitis ?- Patient had spider bite on left elbow on March 15 ?-Treated with doxycycline ?-Developed erythema around left elbow wound, wound appears dry ?-Started on IV ceftriaxone ? ?Dementia with behavioral disturbance ?- Patient has been confused and intermittently agitated ?-Required Haldol last night, mittens in place ?-Will increase dose of Depakote to 250 mg p.o. twice daily ?-Received Haldol last night for agitation ?-Continue Seroquel 12.5 mg at bedtime ? ? ? ? ?  ?Medications ? ?  ? divalproex  250 mg Oral Q12H  ? QUEtiapine  12.5 mg Oral QHS  ? rivastigmine  1.5 mg Oral BID WC  ? ? ? Data Reviewed:  ? ?CBG: ? ?No results for input(s): GLUCAP in the last 168 hours. ? ?SpO2: 96 %  ? ? ?Vitals:  ? 12/14/21 0215 12/14/21 0559 12/14/21 1025 12/14/21 1411  ?BP: (!) 142/82 135/62 126/62 (!) 144/74  ?Pulse: 96 (!) 108 90 (!) 102  ?Resp: 18 20 14 16   ?Temp: 98 ?F (36.7 ?C) 98 ?F (36.7 ?C) 98.8 ?F (37.1 ?C) 98.6 ?F (37 ?C)  ?TempSrc: Oral Oral Axillary Axillary  ?SpO2: 100% 97% 93% 96%  ?Weight: 97.5 kg     ?Height: 6\' 3"  (1.905 m)     ? ? ? ? ?Data  Reviewed: ? ?Basic Metabolic Panel: ?Recent Labs  ?Lab 12/13/21 ?1911 12/14/21 ?[id number]  ?NA 135 134*  ?K 3.6 3.5  ?CL 100 101  ?CO2 25 24  ?GLUCOSE 104* 102*  ?BUN 23 15  ?CREATININE 0.62 0.60*  ?CALCIUM 8.9 8.3*  ?MG  --  2.0  ?PHOS  --  2.8  ? ? ?CBC: ?Recent Labs  ?Lab 12/13/21 ?1911 12/14/21 ?[id number]  ?WBC 11.1* 9.9  ?NEUTROABS 8.3* 8.0*  ?HGB 12.7* 12.2*  ?HCT 37.5* 35.9*  ?MCV 95.2 95.0  ?PLT 199 182  ? ? ?LFT ?Recent Labs  ?Lab 12/13/21 ?1911 12/14/21 ?[id number]  ?AST 28 23  24   ?ALT 26 23  23   ?ALKPHOS 79 66  66  ?BILITOT 1.2 0.9  1.0  ?PROT 6.9 6.4*  6.2*  ?ALBUMIN 3.3* 3.0*  2.9*  ? ?  ?Micro : Blood cultures x2 are negative to date, urine culture is negative ? ? ? ?Antibiotics: ?Anti-infectives (From admission, onward)  ? ? Start     Dose/Rate Route Frequency Ordered Stop  ? 12/13/21 2245  vancomycin (VANCOCIN) IVPB 1000 mg/200 mL premix       ? 1,000 mg ?200 mL/hr over 60 Minutes Intravenous  Once 12/13/21 2242 12/14/21 0117  ? 12/13/21 2245  cefTRIAXone (ROCEPHIN) 2 g in sodium chloride 0.9 % 100 mL  IVPB       ? 2 g ?200 mL/hr over 30 Minutes Intravenous Every 24 hours 12/13/21 2242    ? ?  ? ? ? ? ? ?DVT prophylaxis: SCDs ? ?Code Status: Full code ? ?Family Communication: No family at bedside ? ?CONSULTS: ? ? ? ?Objective  ? ? ?Physical Examination: ? ? ?General: Appears in no acute distress, pleasantly confused ?Cardiovascular: S1-S2, regular, no murmur auscultated ?Respiratory: Clear to auscultation bilaterally, no wheezing or crackles auscultated ?Abdomen: Abdomen is soft, nontender, no organomegaly ?Extremities: No edema in the lower extremities ?Neurologic: Alert, not needed x3, confused ? ? ?Status is: Inpatient:     ? ? ? ?  ? ? ? ?Oswald Hillock ?  ?[facility] ?If 7PM-7AM, please contact night-coverage at [email or web address], ?Office  [phone] ? ? ?12/14/2021, 2:48 PM  LOS: 0 days  ? ? ? ? ? ? ? ? ? ? ?  ?

## 2021-12-14 NOTE — Assessment & Plan Note (Addendum)
-   Treated with Rocephin  ?-Urine culture grew MRSA ?-Started on vancomycin per pharmacy ?

## 2021-12-15 DIAGNOSIS — W57XXXA Bitten or stung by nonvenomous insect and other nonvenomous arthropods, initial encounter: Secondary | ICD-10-CM | POA: Diagnosis present

## 2021-12-15 DIAGNOSIS — E785 Hyperlipidemia, unspecified: Secondary | ICD-10-CM | POA: Diagnosis present

## 2021-12-15 DIAGNOSIS — Z7189 Other specified counseling: Secondary | ICD-10-CM | POA: Diagnosis not present

## 2021-12-15 DIAGNOSIS — N390 Urinary tract infection, site not specified: Secondary | ICD-10-CM | POA: Diagnosis present

## 2021-12-15 DIAGNOSIS — B9562 Methicillin resistant Staphylococcus aureus infection as the cause of diseases classified elsewhere: Secondary | ICD-10-CM | POA: Diagnosis present

## 2021-12-15 DIAGNOSIS — F05 Delirium due to known physiological condition: Secondary | ICD-10-CM | POA: Diagnosis present

## 2021-12-15 DIAGNOSIS — L03114 Cellulitis of left upper limb: Secondary | ICD-10-CM | POA: Diagnosis present

## 2021-12-15 DIAGNOSIS — Z8249 Family history of ischemic heart disease and other diseases of the circulatory system: Secondary | ICD-10-CM | POA: Diagnosis not present

## 2021-12-15 DIAGNOSIS — Z66 Do not resuscitate: Secondary | ICD-10-CM | POA: Diagnosis present

## 2021-12-15 DIAGNOSIS — F02811 Dementia in other diseases classified elsewhere, unspecified severity, with agitation: Secondary | ICD-10-CM | POA: Diagnosis present

## 2021-12-15 DIAGNOSIS — I10 Essential (primary) hypertension: Secondary | ICD-10-CM | POA: Diagnosis present

## 2021-12-15 DIAGNOSIS — Z515 Encounter for palliative care: Secondary | ICD-10-CM | POA: Diagnosis not present

## 2021-12-15 DIAGNOSIS — N400 Enlarged prostate without lower urinary tract symptoms: Secondary | ICD-10-CM | POA: Diagnosis present

## 2021-12-15 DIAGNOSIS — N3000 Acute cystitis without hematuria: Secondary | ICD-10-CM | POA: Diagnosis not present

## 2021-12-15 DIAGNOSIS — F03918 Unspecified dementia, unspecified severity, with other behavioral disturbance: Secondary | ICD-10-CM | POA: Diagnosis not present

## 2021-12-15 DIAGNOSIS — F02818 Dementia in other diseases classified elsewhere, unspecified severity, with other behavioral disturbance: Secondary | ICD-10-CM | POA: Diagnosis present

## 2021-12-15 DIAGNOSIS — Z20822 Contact with and (suspected) exposure to covid-19: Secondary | ICD-10-CM | POA: Diagnosis present

## 2021-12-15 DIAGNOSIS — Z888 Allergy status to other drugs, medicaments and biological substances status: Secondary | ICD-10-CM | POA: Diagnosis not present

## 2021-12-15 DIAGNOSIS — G3183 Dementia with Lewy bodies: Secondary | ICD-10-CM | POA: Diagnosis present

## 2021-12-15 DIAGNOSIS — A419 Sepsis, unspecified organism: Secondary | ICD-10-CM | POA: Diagnosis not present

## 2021-12-15 DIAGNOSIS — Z79899 Other long term (current) drug therapy: Secondary | ICD-10-CM | POA: Diagnosis not present

## 2021-12-15 LAB — COMPREHENSIVE METABOLIC PANEL
ALT: 23 U/L (ref 0–44)
AST: 22 U/L (ref 15–41)
Albumin: 2.9 g/dL — ABNORMAL LOW (ref 3.5–5.0)
Alkaline Phosphatase: 67 U/L (ref 38–126)
Anion gap: 6 (ref 5–15)
BUN: 11 mg/dL (ref 8–23)
CO2: 28 mmol/L (ref 22–32)
Calcium: 8.6 mg/dL — ABNORMAL LOW (ref 8.9–10.3)
Chloride: 103 mmol/L (ref 98–111)
Creatinine, Ser: 0.65 mg/dL (ref 0.61–1.24)
GFR, Estimated: >60 mL/min (ref >60)
Glucose, Bld: 100 mg/dL — ABNORMAL HIGH (ref 70–99)
Potassium: 3.9 mmol/L (ref 3.5–5.1)
Sodium: 137 mmol/L (ref 135–145)
Total Bilirubin: 0.7 mg/dL (ref 0.3–1.2)
Total Protein: 6.7 g/dL (ref 6.5–8.1)

## 2021-12-15 LAB — CBC
HCT: 37.2 % — ABNORMAL LOW (ref 39.0–52.0)
Hemoglobin: 12.7 g/dL — ABNORMAL LOW (ref 13.0–17.0)
MCH: 32.2 pg (ref 26.0–34.0)
MCHC: 34.1 g/dL (ref 30.0–36.0)
MCV: 94.4 fL (ref 80.0–100.0)
Platelets: 201 10*3/uL (ref 150–400)
RBC: 3.94 MIL/uL — ABNORMAL LOW (ref 4.22–5.81)
RDW: 12.2 % (ref 11.5–15.5)
WBC: 8.7 10*3/uL (ref 4.0–10.5)
nRBC: 0 % (ref 0.0–0.2)

## 2021-12-15 LAB — MRSA NEXT GEN BY PCR, NASAL: MRSA by PCR Next Gen: DETECTED — AB

## 2021-12-15 MED ORDER — CHLORHEXIDINE GLUCONATE CLOTH 2 % EX PADS
6.0000 | MEDICATED_PAD | Freq: Every day | CUTANEOUS | Status: DC
  Administered 2021-12-17 – 2021-12-19 (×3): 6 via TOPICAL

## 2021-12-15 MED ORDER — MUPIROCIN 2 % EX OINT
1.0000 "application " | TOPICAL_OINTMENT | Freq: Two times a day (BID) | CUTANEOUS | Status: DC
  Administered 2021-12-15 – 2021-12-18 (×7): 1 via NASAL
  Filled 2021-12-15 (×2): qty 22

## 2021-12-15 MED ORDER — VANCOMYCIN HCL 1250 MG/250ML IV SOLN
1250.0000 mg | Freq: Two times a day (BID) | INTRAVENOUS | Status: DC
  Administered 2021-12-15 – 2021-12-18 (×6): 1250 mg via INTRAVENOUS
  Filled 2021-12-15 (×6): qty 250

## 2021-12-15 NOTE — Progress Notes (Signed)
[facility] ? ?PROGRESS NOTE ? ?Walter [name] PIR:[id number] DOB: [birth year] DOA: 12/13/2021 ?PCP: Alfredia Ferguson, PA-C ? ?Brief hospital course ? ?71 y.o. male with medical history significant of dementia BPH hypertension, Lewy body dementia with behavioral disturbance  ?  ?  ?Presented with fever and possible infection of the wound  ?  ?Patient comes in with confusion and fever ?He has dementia at baseline and does have significant agitation. ?Arrives from [place] [facility] ?Where they thought that he may have had a spider bite on 15 March with a red wound inflamed he has been treated with doxycycline ?Febrile up to 100.4 ?    ? ? ? ?Subjective  ? ?Patient seen and examined, continues to be confused.  As per nursing staff he slept well after dose of Depakote was changed to 50 mg p.o. twice daily. ? ?  ? ?Assessment and Plan: ? ?* UTI (urinary tract infection) ?- Treated with Rocephin  ?-follow urine culture results ? ? ?Cellulitis ?- Patient had spider bite on left elbow on March 15 ?-Treated with doxycycline ?-Developed erythema around left elbow wound, wound appears dry ?-Improving ?-Started on IV ceftriaxone ? ?Dementia with behavioral disturbance ?- Patient has been confused and intermittently agitated ?-Required Haldol last night, mittens in place ?-Agitation has improved after with change the dose of Depakote to 250 mg twice a day ?-Received Haldol last night for agitation ?-Continue Seroquel 12.5 mg at bedtime ?-Continue rivastigmine 1.5 mg p.o. twice daily ? ? ? ?Medications ? ?  ? divalproex  250 mg Oral Q12H  ? QUEtiapine  12.5 mg Oral QHS  ? rivastigmine  1.5 mg Oral BID WC  ? ? ? Data Reviewed:  ? ?CBG: ? ?No results for input(s): GLUCAP in the last 168 hours. ? ?SpO2: 93 %  ? ? ?Vitals:  ? 12/14/21 1025 12/14/21 1411 12/14/21 2007 12/15/21 0622  ?BP: 126/62 (!) 144/74 (!) 147/72 (!) 152/56  ?Pulse: 90 (!) 102 (!) 108 (!) 105  ?Resp: 14 16 18 18   ?Temp: 98.8 ?F (37.1 ?C) 98.6 ?F (37 ?C) 99.6 ?F  (37.6 ?C) 98.1 ?F (36.7 ?C)  ?TempSrc: Axillary Axillary Oral Axillary  ?SpO2: 93% 96% 95% 93%  ?Weight:      ?Height:      ? ? ? ? ?Data Reviewed: ? ?Basic Metabolic Panel: ?Recent Labs  ?Lab 12/13/21 ?1911 12/14/21 ?12/16/21 12/15/21 ?0446  ?NA 135 134* 137  ?K 3.6 3.5 3.9  ?CL 100 101 103  ?CO2 25 24 28   ?GLUCOSE 104* 102* 100*  ?BUN 23 15 11   ?CREATININE 0.62 0.60* 0.65  ?CALCIUM 8.9 8.3* 8.6*  ?MG  --  2.0  --   ?PHOS  --  2.8  --   ? ? ?CBC: ?Recent Labs  ?Lab 12/13/21 ?1911 12/14/21 ? 12/15/21 ?0446  ?WBC 11.1* 9.9 8.7  ?NEUTROABS 8.3* 8.0*  --   ?HGB 12.7* 12.2* 12.7*  ?HCT 37.5* 35.9* 37.2*  ?MCV 95.2 95.0 94.4  ?PLT 199 182 201  ? ? ?LFT ?Recent Labs  ?Lab 12/13/21 ?1911 12/14/21 ?12/17/21 12/15/21 ?0446  ?AST 28 23  24 22   ?ALT 26 23  23 23   ?ALKPHOS 79 66  66 67  ?BILITOT 1.2 0.9  1.0 0.7  ?PROT 6.9 6.4*  6.2* 6.7  ?ALBUMIN 3.3* 3.0*  2.9* 2.9*  ? ?  ?Micro : Blood cultures x2 are negative to date, urine culture is negative ? ? ? ?Antibiotics: ?Anti-infectives (From admission, onward)  ? ? Start  Dose/Rate Route Frequency Ordered Stop  ? 12/13/21 2245  vancomycin (VANCOCIN) IVPB 1000 mg/200 mL premix       ? 1,000 mg ?200 mL/hr over 60 Minutes Intravenous  Once 12/13/21 2242 12/15/21 0932  ? 12/13/21 2245  cefTRIAXone (ROCEPHIN) 2 g in sodium chloride 0.9 % 100 mL IVPB       ? 2 g ?200 mL/hr over 30 Minutes Intravenous Every 24 hours 12/13/21 2242    ? ?  ? ? ? ? ? ?DVT prophylaxis: SCDs ? ?Code Status: Full code ? ?Family Communication: No family at bedside ? ?CONSULTS: ? ? ? ?Objective  ? ? ?Physical Examination: ? ?General-appears confused, mittens in place ?Heart-S1-S2, regular, no murmur auscultated ?Lungs-clear to auscultation bilaterally, no wheezing or crackles auscultated ?Abdomen-soft, nontender, no organomegaly ?Extremities-erythema in left elbow ?Neuro-alert, confused ? ? ?Status is: Inpatient:     ? ?  ? ?Meredeth Ide ?  ?[facility] ?If 7PM-7AM, please contact night-coverage at  [email or web address], ?Office  [phone] ? ? ?12/15/2021, 11:45 AM  LOS: 0 days  ? ? ? ? ? ? ? ? ? ? ?  ?

## 2021-12-15 NOTE — TOC Initial Note (Signed)
Transition of Care (TOC) - Initial/Assessment Note  ? ? ?Patient Details  ?Name: Walter [name] ?MRN: [id number] ?Date of Birth: 1950-12-20 ? ?[facility]) CM/SW Contact:    ?Walter Clam, RN ?Phone Number: ?12/15/2021, 12:20 PM ? ?Clinical Narrative:  From [facility]-memory care to return. Hand mitts currently-must be d/c 24hrs prior returning back to facility.               ? ? ?Expected Discharge Plan: Memory Care ?Barriers to Discharge: Continued Medical Work up ? ? ?Patient Goals and CMS Choice ?Patient states their goals for this hospitalization and ongoing recovery are:: [facility]-memory care ?CMS Medicare.gov Compare Post Acute Care list provided to:: Patient Represenative (must comment) ?Choice offered to / list presented to : Spouse Walter [name] spouse) ? ?Expected Discharge Plan and Services ?Expected Discharge Plan: Memory Care ?  ?Discharge Planning Services: CM Consult ?Post Acute Care Choice:  ([facility]-memory care) ?Living arrangements for the past 2 months:  (facility-[facility]) ?                ?  ?  ?  ?  ?  ?  ?  ?  ?  ?  ? ?Prior Living Arrangements/Services ?Living arrangements for the past 2 months:  (facility-[facility]) ?Lives with:: Facility Resident ?  ?Do you feel safe going back to the place where you live?: Yes      ?  ?Care giver support system in place?: Yes (comment) ?  ?Criminal Activity/Legal Involvement Pertinent to Current Situation/Hospitalization: No - Comment as needed ? ?Activities of Daily Living ?Home Assistive Devices/Equipment: Grab bars around toilet ?ADL Screening (condition at time of admission) ?Patient's cognitive ability adequate to safely complete daily activities?: No ?Is the patient deaf or have difficulty hearing?: No ?Does the patient have difficulty seeing, even when wearing glasses/contacts?: No ?Does the patient have difficulty concentrating, remembering, or making decisions?: Yes ?Patient able to express need for assistance  with ADLs?: No ?Does the patient have difficulty dressing or bathing?: Yes ?Independently performs ADLs?: No ?Does the patient have difficulty walking or climbing stairs?: No ?Weakness of Legs: None ?Weakness of Arms/Hands: Right ? ?Permission Sought/Granted ?Permission sought to share information with : Case Manager ?Permission granted to share information with : Yes, Verbal Permission Granted ? Share Information with NAME: Case manager ?   ?   ?   ? ?Emotional Assessment ?Appearance:: Appears stated age ?Attitude/Demeanor/Rapport: Gracious ?Affect (typically observed): Agitated ?Orientation: : Oriented to Self ?Alcohol / Substance Use: Not Applicable ?Psych Involvement: No (comment) ? ?Admission diagnosis:  UTI (urinary tract infection) [N39.0] ?Cellulitis [L03.90] ?Patient Active Problem List  ? Diagnosis Date Noted  ? Cellulitis 12/14/2021  ? Sepsis (HCC) 12/14/2021  ? UTI (urinary tract infection) 12/13/2021  ? Dementia with behavioral disturbance 12/13/2021  ? Disease of prostate 01/13/2016  ? Snores 01/13/2016  ? Hyperlipidemia 01/13/2016  ? 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 Colon, diverticulosis 12/13/2006  ? ?PCP:  Walter Ferguson, PA-C ?Pharmacy:   ?[facility] - [place], [place] - [address] ?[address] ?[place] [address] ?Phone: [phone] Fax: [phone] ? ? ? ? ?Social Determinants of Health (SDOH) Interventions ?  ? ?Readmission Risk Interventions ?No flowsheet data found. ? ? ?

## 2021-12-15 NOTE — Progress Notes (Signed)
Pharmacy Antibiotic Note ? ?Walter [name] is a 71 y.o. male admitted on 12/13/2021 with cellulitis and staph aureus UTI. Pharmacy has been consulted for Vancomycin dosing. ? ?Staph aureus in the urine is not a typical pathogen outside of a chronic foley/cath and concerning for seeding from an alternative site - in this case cellulitis/wound. Sensitivities still pending at this time.  ? ?Plan: ?- Start Vancomycin 1250 mg IV every 12 hours (eAUC 402, SCr 0.8, Vd 0.72) ?- Will continue to follow renal function, culture results, LOT, and antibiotic de-escalation plans  ? ?Height: 6\' 3"  (190.5 cm) ?Weight: 97.5 kg (214 lb 15.2 oz) ?IBW/kg (Calculated) : 84.5 ? ?Temp (24hrs), Avg:98.8 ?F (37.1 ?C), Min:98.1 ?F (36.7 ?C), Max:99.6 ?F (37.6 ?C) ? ?Recent Labs  ?Lab 12/13/21 ?1827 12/13/21 ?1910 12/13/21 ?1911 12/14/21 ?[id number] 12/15/21 ?0446  ?WBC  --   --  11.1* 9.9 8.7  ?CREATININE  --   --  0.62 0.60* 0.65  ?LATICACIDVEN 0.8 1.2  --   --   --   ?  ?Estimated Creatinine Clearance: 101.2 mL/min (by C-G formula based on SCr of 0.65 mg/dL).   ? ?Allergies  ?Allergen Reactions  ? Antihistamines, Chlorpheniramine-Type   ?  Not documented on MAR  ? ? ?Antimicrobials this admission: ?Vanc 3/19 x 1; restart 3/20 ?CTX 3/18 >> 3/20 ? ?Dose adjustments this admission: ? ? ?Microbiology results: ?3/18 UCx > 80k staph aureus ?3/18 BCx >> ngx1d ?3/19 MRSA PCR >> positive ? ?Thank you for allowing pharmacy to be a part of this patient?s care. ? ?Alycia Rossetti, PharmD, BCPS ?Infectious Diseases Clinical Pharmacist ?12/15/2021 2:26 PM  ? ?**Pharmacist phone directory can now be found on [email or web address] ([email or web address]).  Listed under [place].  ?

## 2021-12-16 DIAGNOSIS — L03114 Cellulitis of left upper limb: Secondary | ICD-10-CM | POA: Diagnosis not present

## 2021-12-16 DIAGNOSIS — F03918 Unspecified dementia, unspecified severity, with other behavioral disturbance: Secondary | ICD-10-CM | POA: Diagnosis not present

## 2021-12-16 LAB — URINE CULTURE: Culture: 80000 — AB

## 2021-12-16 NOTE — Progress Notes (Signed)
[facility] ? ?PROGRESS NOTE ? ?TRONG GOSLING GGY:[id number] DOB: May 15, 1951 DOA: 12/13/2021 ?PCP: Alfredia Ferguson, PA-C ? ?Brief hospital course ? ?71 y.o. male with medical history significant of dementia BPH hypertension, Lewy body dementia with behavioral disturbance  ?  ?  ?Presented with fever and possible infection of the wound  ?  ?Patient comes in with confusion and fever ?He has dementia at baseline and does have significant agitation. ?Arrives from [place] [facility] ?Where they thought that he may have had a spider bite on 15 March with a red wound inflamed he has been treated with doxycycline ?Febrile up to 100.4 ?    ? ? ? ?Subjective  ? ?Patient seen and examined, urine culture growing MRSA.  Patient started on vancomycin.  Agitation has improved with increased dose of Depakote. ? ?  ? ?Assessment and Plan: ? ?* UTI (urinary tract infection) ?- Treated with Rocephin  ?-Urine culture grew MRSA ?-Started on vancomycin per pharmacy ? ?Cellulitis ?- Patient had spider bite on left elbow on March 15 ?-Treated with doxycycline ?-Developed erythema around left elbow wound, wound appears dry ?-Improving ?-Started on IV ceftriaxone ?-Antibiotics have been changed to vancomycin due to MRSA  In  urine ? ?Dementia with behavioral disturbance ?- Patient has been confused and intermittently agitated ?-Required Haldol last night, mittens in place ?-Agitation has improved after with change the dose of Depakote to 250 mg twice a day ?-Received Haldol last night for agitation ?-Continue Seroquel 12.5 mg at bedtime ?-Continue rivastigmine 1.5 mg p.o. twice daily ? ? ? ?Medications ? ?  ? Chlorhexidine Gluconate Cloth  6 each Topical Q0600  ? divalproex  250 mg Oral Q12H  ? mupirocin ointment  1 application. Nasal BID  ? QUEtiapine  12.5 mg Oral QHS  ? rivastigmine  1.5 mg Oral BID WC  ? ? ? Data Reviewed:  ? ?CBG: ? ?No results for input(s): GLUCAP in the last 168 hours. ? ?SpO2: 97 %  ? ? ?Vitals:  ? 12/15/21 1205  12/15/21 2201 12/16/21 0448 12/16/21 1345  ?BP: 135/78 122/89 136/67 124/61  ?Pulse: 92 (!) 103 90 (!) 105  ?Resp: 16 18 18 14   ?Temp: 98.6 ?F (37 ?C) 99.6 ?F (37.6 ?C) 98.8 ?F (37.1 ?C) 98.8 ?F (37.1 ?C)  ?TempSrc:  Axillary Oral   ?SpO2: 98% 96% 97% 97%  ?Weight:      ?Height:      ? ? ? ? ?Data Reviewed: ? ?Basic Metabolic Panel: ?Recent Labs  ?Lab 12/13/21 ?1911 12/14/21 ?12/16/21 12/15/21 ?0446  ?NA 135 134* 137  ?K 3.6 3.5 3.9  ?CL 100 101 103  ?CO2 25 24 28   ?GLUCOSE 104* 102* 100*  ?BUN 23 15 11   ?CREATININE 0.62 0.60* 0.65  ?CALCIUM 8.9 8.3* 8.6*  ?MG  --  2.0  --   ?PHOS  --  2.8  --   ? ? ?CBC: ?Recent Labs  ?Lab 12/13/21 ?1911 12/14/21 ? 12/15/21 ?0446  ?WBC 11.1* 9.9 8.7  ?NEUTROABS 8.3* 8.0*  --   ?HGB 12.7* 12.2* 12.7*  ?HCT 37.5* 35.9* 37.2*  ?MCV 95.2 95.0 94.4  ?PLT 199 182 201  ? ? ?LFT ?Recent Labs  ?Lab 12/13/21 ?1911 12/14/21 ?12/17/21 12/15/21 ?0446  ?AST 28 23  24 22   ?ALT 26 23  23 23   ?ALKPHOS 79 66  66 67  ?BILITOT 1.2 0.9  1.0 0.7  ?PROT 6.9 6.4*  6.2* 6.7  ?ALBUMIN 3.3* 3.0*  2.9* 2.9*  ? ?  ?  Micro : Blood cultures x2 are negative to date, urine culture is negative ? ? ? ?Antibiotics: ?Anti-infectives (From admission, onward)  ? ? Start     Dose/Rate Route Frequency Ordered Stop  ? 12/15/21 1600  vancomycin (VANCOREADY) IVPB 1250 mg/250 mL       ? 1,250 mg ?166.7 mL/hr over 90 Minutes Intravenous Every 12 hours 12/15/21 1417    ? 12/13/21 2245  vancomycin (VANCOCIN) IVPB 1000 mg/200 mL premix       ? 1,000 mg ?200 mL/hr over 60 Minutes Intravenous  Once 12/13/21 2242 12/15/21 0932  ? 12/13/21 2245  cefTRIAXone (ROCEPHIN) 2 g in sodium chloride 0.9 % 100 mL IVPB  Status:  Discontinued       ? 2 g ?200 mL/hr over 30 Minutes Intravenous Every 24 hours 12/13/21 2242 12/15/21 1415  ? ?  ? ? ? ? ? ?DVT prophylaxis: SCDs ? ?Code Status: Full code ? ?Family Communication: No family at bedside ? ?CONSULTS: ? ? ? ?Objective  ? ? ?Physical Examination: ? ?General-appears confused, mittens in  place ?Heart-S1-S2, regular, no murmur auscultated ?Lungs-clear to auscultation bilaterally, no wheezing or crackles auscultated ?Abdomen-soft, nontender, no organomegaly ?Extremities-no edema in the lower extremities ?Neuro-alert, confused, moving all extremities ? ?Status is: Inpatient:     ? ?  ? ?Meredeth Ide ?  ?[facility] ?If 7PM-7AM, please contact night-coverage at [email or web address], ?Office  [phone] ? ? ?12/16/2021, 2:27 PM  LOS: 1 day  ? ? ? ? ? ? ? ? ? ? ?  ?

## 2021-12-17 DIAGNOSIS — Z515 Encounter for palliative care: Secondary | ICD-10-CM

## 2021-12-17 DIAGNOSIS — G3183 Dementia with Lewy bodies: Secondary | ICD-10-CM

## 2021-12-17 DIAGNOSIS — Z7189 Other specified counseling: Secondary | ICD-10-CM

## 2021-12-17 DIAGNOSIS — L03114 Cellulitis of left upper limb: Secondary | ICD-10-CM | POA: Diagnosis not present

## 2021-12-17 DIAGNOSIS — F03918 Unspecified dementia, unspecified severity, with other behavioral disturbance: Secondary | ICD-10-CM | POA: Diagnosis not present

## 2021-12-17 DIAGNOSIS — F0280 Dementia in other diseases classified elsewhere without behavioral disturbance: Secondary | ICD-10-CM

## 2021-12-17 DIAGNOSIS — N3000 Acute cystitis without hematuria: Secondary | ICD-10-CM | POA: Diagnosis not present

## 2021-12-17 LAB — CREATININE, SERUM
Creatinine, Ser: 0.56 mg/dL — ABNORMAL LOW (ref 0.61–1.24)
GFR, Estimated: >60 mL/min (ref >60)

## 2021-12-17 MED ORDER — QUETIAPINE FUMARATE 25 MG PO TABS
25.0000 mg | ORAL_TABLET | Freq: Every day | ORAL | Status: DC
  Administered 2021-12-17: 25 mg via ORAL
  Filled 2021-12-17: qty 1

## 2021-12-17 MED ORDER — HALOPERIDOL LACTATE 5 MG/ML IJ SOLN
2.0000 mg | Freq: Four times a day (QID) | INTRAMUSCULAR | Status: DC | PRN
Start: 2021-12-17 — End: 2021-12-18
  Administered 2021-12-17: 2 mg via INTRAVENOUS
  Filled 2021-12-17: qty 1

## 2021-12-17 NOTE — Progress Notes (Signed)
[facility] PROGRESS NOTE    Progress Note  Walter [name]  JYN:[id number] DOB: 1950/12/03 DOA: 12/13/2021 PCP: Alfredia Ferguson, PA-C     Brief Narrative:   Walter [name] is an 71 y.o. male past medical history significant for dementia with behavioral disturbances BPH essential hypertension comes in with fever and possible infection    Assessment/Plan:   Left arm cellulitis: Initially started on IV doxycycline this was transitioned to IV vancomycin. Erythema and swelling has receded. Tmax of 98.8. Continue IV Vanco. Out of bed to chair consult, Occupational Therapy saw the patient recommended back to skilled nursing facility.  Possible UTI (urinary tract infection) Currently on IV vancomycin.  Dementia with behavioral disturbance Agitated overnight has been confused. Required mittens and Haldol. Currently on Depakote twice a day. Increase Seroquel at bedtime. Continue rivastigmine  DVT prophylaxis: lovenox Family Communication:none Status is: Inpatient Remains inpatient appropriate because: Left arm cellulitis    Code Status:     Code Status Orders  (From admission, onward)           Start     Ordered   12/13/21 2312  Full code  Continuous        12/13/21 2316           Code Status History     This patient has a current code status but no historical code status.      Advance Directive Documentation    Flowsheet Row Most Recent Value  Type of Advance Directive Living will, Healthcare Power of Attorney  Pre-existing out of facility DNR order (yellow form or pink MOST form) --  "MOST" Form in Place? --         IV Access:   Peripheral IV   Procedures and diagnostic studies:   No results found.   Medical Consultants:   None.   Subjective:    Walter [name] no complaints this morning groggy and sleepy  Objective:    Vitals:   12/16/21 0448 12/16/21 1345 12/16/21 2100 12/17/21 0455  BP: 136/67 124/61 128/60 113/74   Pulse: 90 (!) 105 100 78  Resp: 18 14 16 18   Temp: 98.8 F (37.1 C) 98.8 F (37.1 C) 98 F (36.7 C) 98.2 F (36.8 C)  TempSrc: Oral  Axillary   SpO2: 97% 97% 98% 91%  Weight:      Height:       SpO2: 91 %   Intake/Output Summary (Last 24 hours) at 12/17/2021 0757 Last data filed at 12/17/2021 8657 Gross per 24 hour  Intake 830 ml  Output --  Net 830 ml   Filed Weights   12/14/21 0215  Weight: 97.5 kg    Exam: General exam: In no acute distress. Respiratory system: Good air movement and clear to auscultation. Cardiovascular system: S1 & S2 heard, RRR. No JVD. Gastrointestinal system: Abdomen is nondistended, soft and nontender.  Extremities: No pedal edema. Skin: No rashes, lesions or ulcers Psychiatry: No judgement or insight into medical condition   Data Reviewed:    Labs: Basic Metabolic Panel: Recent Labs  Lab 12/13/21 1911 12/14/21 0833 12/15/21 0446  NA 135 134* 137  K 3.6 3.5 3.9  CL 100 101 103  CO2 25 24 28   GLUCOSE 104* 102* 100*  BUN 23 15 11   CREATININE 0.62 0.60* 0.65  CALCIUM 8.9 8.3* 8.6*  MG  --  2.0  --   PHOS  --  2.8  --    GFR Estimated Creatinine Clearance: 101.2 mL/min (  by C-G formula based on SCr of 0.65 mg/dL). Liver Function Tests: Recent Labs  Lab 12/13/21 1911 12/14/21 0833 12/15/21 0446  AST 28 23  24 22   ALT 26 23  23 23   ALKPHOS 79 66  66 67  BILITOT 1.2 0.9  1.0 0.7  PROT 6.9 6.4*  6.2* 6.7  ALBUMIN 3.3* 3.0*  2.9* 2.9*   No results for input(s): LIPASE, AMYLASE in the last 168 hours. No results for input(s): AMMONIA in the last 168 hours. Coagulation profile Recent Labs  Lab 12/13/21 1911  INR 1.2   COVID-19 Labs  No results for input(s): DDIMER, FERRITIN, LDH, CRP in the last 72 hours.  Lab Results  Component Value Date   SARSCOV2NAA NEGATIVE 12/13/2021    CBC: Recent Labs  Lab 12/13/21 1911 12/14/21 0833 12/15/21 0446  WBC 11.1* 9.9 8.7  NEUTROABS 8.3* 8.0*  --   HGB 12.7* 12.2*  12.7*  HCT 37.5* 35.9* 37.2*  MCV 95.2 95.0 94.4  PLT 199 182 201   Cardiac Enzymes: Recent Labs  Lab 12/14/21 0833  CKTOTAL 199   BNP (last 3 results) No results for input(s): PROBNP in the last 8760 hours. CBG: No results for input(s): GLUCAP in the last 168 hours. D-Dimer: No results for input(s): DDIMER in the last 72 hours. Hgb A1c: No results for input(s): HGBA1C in the last 72 hours. Lipid Profile: No results for input(s): CHOL, HDL, LDLCALC, TRIG, CHOLHDL, LDLDIRECT in the last 72 hours. Thyroid function studies: Recent Labs    12/14/21 0833  TSH 0.438   Anemia work up: No results for input(s): VITAMINB12, FOLATE, FERRITIN, TIBC, IRON, RETICCTPCT in the last 72 hours. Sepsis Labs: Recent Labs  Lab 12/13/21 1827 12/13/21 1910 12/13/21 1911 12/14/21 0833 12/15/21 0446  PROCALCITON  --   --   --  <0.10  --   WBC  --   --  11.1* 9.9 8.7  LATICACIDVEN 0.8 1.2  --   --   --    Microbiology Recent Results (from the past 240 hour(s))  Blood Culture (routine x 2)     Status: None (Preliminary result)   Collection Time: 12/13/21  6:15 PM   Specimen: BLOOD  Result Value Ref Range Status   Specimen Description   Final    BLOOD RIGHT ARM Performed at [facility], [address] [place], [place] [address]    Special Requests   Final    BOTTLES DRAWN AEROBIC AND ANAEROBIC Blood Culture adequate volume Performed at [facility], [address] [place], [place] [address]    Culture   Final    NO GROWTH 3 DAYS Performed at [facility], [address], [place], [place] [address]    Report Status PENDING  Incomplete  Resp Panel by RT-PCR (Flu A&B, Covid) Nasopharyngeal Swab     Status: None   Collection Time: 12/13/21  7:11 PM   Specimen: Nasopharyngeal Swab; Nasopharyngeal(NP) swabs in vial transport medium  Result Value Ref Range Status   SARS Coronavirus 2 by RT PCR NEGATIVE NEGATIVE Final    Comment:  (NOTE) SARS-CoV-2 target nucleic acids are NOT DETECTED.  The SARS-CoV-2 RNA is generally detectable in upper respiratory specimens during the acute phase of infection. The lowest concentration of SARS-CoV-2 viral copies this assay can detect is 138 copies/mL. A negative result does not preclude SARS-Cov-2 infection and should not be used as the sole basis for treatment or other patient management decisions. A  negative result may occur with  improper specimen collection/handling, submission of specimen other than nasopharyngeal swab, presence of viral mutation(s) within the areas targeted by this assay, and inadequate number of viral copies(<138 copies/mL). A negative result must be combined with clinical observations, patient history, and epidemiological information. The expected result is Negative.  Fact Sheet for Patients:  BloggerCourse.com  Fact Sheet for Healthcare Providers:  SeriousBroker.it  This test is no t yet approved or cleared by the [place] FDA and  has been authorized for detection and/or diagnosis of SARS-CoV-2 by FDA under an Emergency Use Authorization (EUA). This EUA will remain  in effect (meaning this test can be used) for the duration of the COVID-19 declaration under Section 564(b)(1) of the Act, 21 U.S.C.section 360bbb-3(b)(1), unless the authorization is terminated  or revoked sooner.       Influenza A by PCR NEGATIVE NEGATIVE Final   Influenza B by PCR NEGATIVE NEGATIVE Final    Comment: (NOTE) The Xpert Xpress SARS-CoV-2/FLU/RSV plus assay is intended as an aid in the diagnosis of influenza from Nasopharyngeal swab specimens and should not be used as a sole basis for treatment. Nasal washings and aspirates are unacceptable for Xpert Xpress SARS-CoV-2/FLU/RSV testing.  Fact Sheet for Patients: BloggerCourse.com  Fact Sheet for Healthcare  Providers: SeriousBroker.it  This test is not yet approved or cleared by the [place] FDA and has been authorized for detection and/or diagnosis of SARS-CoV-2 by FDA under an Emergency Use Authorization (EUA). This EUA will remain in effect (meaning this test can be used) for the duration of the COVID-19 declaration under Section 564(b)(1) of the Act, 21 U.S.C. section 360bbb-3(b)(1), unless the authorization is terminated or revoked.  Performed at [facility], [address], [place], [place] [address]   Blood Culture (routine x 2)     Status: None (Preliminary result)   Collection Time: 12/13/21  7:27 PM   Specimen: BLOOD  Result Value Ref Range Status   Specimen Description   Final    BLOOD BLOOD RIGHT FOREARM Performed at [facility], [address], [place], [place] [address]    Special Requests   Final    BOTTLES DRAWN AEROBIC AND ANAEROBIC Blood Culture adequate volume Performed at [facility], [address] [place], [place] [address]    Culture   Final    NO GROWTH 3 DAYS Performed at [facility], [address], [place], [place] [address]    Report Status PENDING  Incomplete  Urine Culture     Status: Abnormal   Collection Time: 12/13/21  8:17 PM   Specimen: In/Out Cath Urine  Result Value Ref Range Status   Specimen Description   Final    IN/OUT CATH URINE Performed at [facility], [address] [place], [place] [address]    Special Requests   Final    NONE Performed at [facility], [address], [place], [place] [address]    Culture (A)  Final    80,000 COLONIES/mL METHICILLIN RESISTANT STAPHYLOCOCCUS AUREUS   Report Status 12/16/2021 FINAL  Final   Organism ID, Bacteria METHICILLIN RESISTANT STAPHYLOCOCCUS AUREUS (A)  Final      Susceptibility   Methicillin resistant staphylococcus aureus - MIC*    CIPROFLOXACIN >=8  RESISTANT Resistant     GENTAMICIN <=0.5 SENSITIVE Sensitive     NITROFURANTOIN <=16 SENSITIVE Sensitive     OXACILLIN >=4 RESISTANT Resistant     TETRACYCLINE <=  1 SENSITIVE Sensitive     VANCOMYCIN <=0.5 SENSITIVE Sensitive     TRIMETH/SULFA <=10 SENSITIVE Sensitive     CLINDAMYCIN <=0.25 SENSITIVE Sensitive     RIFAMPIN <=0.5 SENSITIVE Sensitive     Inducible Clindamycin NEGATIVE Sensitive     * 80,000 COLONIES/mL METHICILLIN RESISTANT STAPHYLOCOCCUS AUREUS  MRSA Next Gen by PCR, Nasal     Status: Abnormal   Collection Time: 12/14/21  6:47 AM   Specimen: Nasal Mucosa; Nasal Swab  Result Value Ref Range Status   MRSA by PCR Next Gen DETECTED (A) NOT DETECTED Final    Comment: (NOTE) The GeneXpert MRSA Assay (FDA approved for NASAL specimens only), is one component of a comprehensive MRSA colonization surveillance program. It is not intended to diagnose MRSA infection nor to guide or monitor treatment for MRSA infections. Test performance is not FDA approved in patients less than 75 years old. Performed at [facility], [address] [place], [place] [address]   MRSA Next Gen by PCR, Nasal     Status: Abnormal   Collection Time: 12/15/21  4:43 PM   Specimen: Nasal Mucosa; Nasal Swab  Result Value Ref Range Status   MRSA by PCR Next Gen DETECTED (A) NOT DETECTED Final    Comment: RESULT CALLED TO, READ BACK BY AND VERIFIED WITH: HENSON,C. RN @1816  ON 12/15/2021 BY COHEN,K (NOTE) The GeneXpert MRSA Assay (FDA approved for NASAL specimens only), is one component of a comprehensive MRSA colonization surveillance program. It is not intended to diagnose MRSA infection nor to guide or monitor treatment for MRSA infections. Test performance is not FDA approved in patients less than 31 years old. Performed at [facility], [address], [place], [place] [address]      Medications:    Chlorhexidine Gluconate Cloth  6 each Topical Q0600    divalproex  250 mg Oral Q12H   mupirocin ointment  1 application. Nasal BID   QUEtiapine  12.5 mg Oral QHS   rivastigmine  1.5 mg Oral BID WC   Continuous Infusions:  vancomycin 1,250 mg (12/17/21 0413)      LOS: 2 days   Marinda Elk  [facility]  12/17/2021, 7:57 AM

## 2021-12-17 NOTE — Consult Note (Addendum)
? ?                                                                                ?Consultation Note ?Date: 12/17/2021  ? ?Patient Name: Walter [name]  ?DOB: [birth year]  MRN: [id number]  Age / Sex: 71 y.o., male  ?PCP: Walter Kirschner, PA-C ?Referring Physician: Oswald Hillock, MD ? ?Reason for Consultation: Establishing goals of care ? ?HPI/Patient Profile: 71 y.o. male  with past medical history of Lewy body dementia, hypertension, BPH admitted on 12/13/2021 from [facility] memory care with fever and concern for cellulitis infection of wound (possible spider bite 12/10/21) been treated with doxycycline. Hospitalization has been complicated by agitation with some improvement with Depakote and UTI. Advance Directive reviewed in electronic chart.  ? ?Clinical Assessment and Goals of Care: ?I met today at Walter [name] bedside after reviewing records and discussing with RN. Walter [name] is sleeping soundly and difficult to arouse. At most he mumbles incomprehensible sounds but no opening eyes, no following command, no movements purposeful or non-purposeful noted. He is not alert enough to take any po including meds.  ? ?I called and spoke with [name] Walter [name] who reports that they have been married 9 years. They have one [name] who is en-route to come visit this evening. Walter [name] and I discuss her husband's condition and concerns for his underlying Lewy body disease effecting his progression. Walter [name] shares that a case worker called and said that we would fix his medical issues before we send him back to his facility. I explained that we are treating his infections but if he has progression of his dementia this is more difficult to treat and fix. Walter [name] shares that she knows. She is tearful explaining his decline and loss of independence. She shares her struggles to care for him until it became too much for her. She expresses that she loves him very much. She tells me that someone asked her if she would want him resuscitated  and she told them "of course I would." We agreed that there is need for further conversation given his tenuous status. She would like for her [name] to be part of this discussion. I informed Walter [name] that I will ask my colleague to reach out to her tomorrow to arrange a time to meet and discuss her husband's plan of care. I prepared her that we will need to discuss some difficult topics of the "what ifs" given the severity of her husband's illness. She is tearful but agrees that this is an important conversation to have and agrees to meeting. She expresses strength through their faith in God and trusting God's will to be done.  ? ?All questions/concerns addressed. Emotional support provided.  ? ?Primary Decision Maker ?HCPOA ?1) Walter [name] ?2) Walter [name] ?3) [place] ?  ? ?SUMMARY OF RECOMMENDATIONS   ?- Tentative family meeting tomorrow- timing TBD ? ?Code Status/Advance Care Planning: ?Full code - will need to discuss tomorrow - his Living Will indicates no life prolonging measures in setting of advanced dementia ? ? ?Symptom Management:  ?Per attending. ? ?Palliative Prophylaxis:  ?Aspiration, Delirium Protocol, Oral Care, and  Turn Reposition ? ?Prognosis:  ?Overall prognosis poor with ongoing mental status changes in setting of Lew body disease.  ? ?Discharge Planning: To Be Determined  ? ?  ? ?Primary Diagnoses: ?Present on Admission: ? UTI (urinary tract infection) ? Dementia with behavioral disturbance ? Cellulitis ? Sepsis ([place]) ? ? ?I have reviewed the medical record, interviewed the patient and family, and examined the patient. The following aspects are pertinent. ? ?Past Medical History:  ?Diagnosis Date  ? Hyperlipidemia   ? ?Social History  ? ?Socioeconomic History  ? Marital status: Married  ?  Spouse name: Not on file  ? Number of children: 1  ? Years of education: Not on file  ? Highest education level: GED or equivalent  ?Occupational History  ? Occupation:  retired  ?Tobacco Use  ? Smoking status: Never  ? Smokeless tobacco: Never  ?Vaping Use  ? Vaping Use: Never used  ?Substance and Sexual Activity  ? Alcohol use: No  ?  Alcohol/week: 0.0 standard drinks  ? Drug use: No  ? Sexual activity: Not on file  ?Other Topics Concern  ? Not on file  ?Social History Narrative  ? Not on file  ? ?Social Determinants of Health  ? ?Financial Resource Strain: Low Risk   ? Difficulty of Paying Living Expenses: Not hard at all  ?Food Insecurity: No Food Insecurity  ? Worried About Charity fundraiser in the Last Year: Never true  ? Ran Out of Food in the Last Year: Never true  ?Transportation Needs: No Transportation Needs  ? Lack of Transportation (Medical): No  ? Lack of Transportation (Non-Medical): No  ?Physical Activity: Inactive  ? Days of Exercise per Week: 0 days  ? Minutes of Exercise per Session: 0 min  ?Stress: No Stress Concern Present  ? Feeling of Stress : Not at all  ?Social Connections: Unknown  ? Frequency of Communication with Friends and Family: Not on file  ? Frequency of Social Gatherings with Friends and Family: Not on file  ? Attends Religious Services: Not on file  ? Active Member of Clubs or Organizations: No  ? Attends Archivist Meetings: Never  ? Marital Status: Married  ? ?Family History  ?Problem Relation Age of Onset  ? Hyperthyroidism Mother   ? Lung cancer Father   ? Heart attack Father   ? Hypothyroidism Sister   ? Coronary artery disease Brother   ? Nephrolithiasis Brother   ? Heart attack Maternal Grandfather   ? Heart disease Paternal Grandmother   ? Emphysema Paternal Grandfather   ? ?Scheduled Meds: ? Chlorhexidine Gluconate Cloth  6 each Topical Q0600  ? divalproex  250 mg Oral Q12H  ? mupirocin ointment  1 application. Nasal BID  ? QUEtiapine  12.5 mg Oral QHS  ? rivastigmine  1.5 mg Oral BID WC  ? ?Continuous Infusions: ? vancomycin 1,250 mg (12/17/21 0413)  ? ?PRN Meds:.acetaminophen **OR** acetaminophen,  HYDROcodone-acetaminophen ?Allergies  ?Allergen Reactions  ? Antihistamines, Chlorpheniramine-Type   ?  Not documented on MAR  ? ?Review of Systems  ?Unable to perform ROS: Dementia  ? ?Physical Exam ?Vitals and nursing note reviewed.  ?Constitutional:   ?   General: He is sleeping.  ?   Appearance: He is ill-appearing.  ?Cardiovascular:  ?   Rate and Rhythm: Normal rate.  ?Pulmonary:  ?   Effort: No tachypnea, accessory muscle usage or respiratory distress.  ?Abdominal:  ?   General: Abdomen is flat.  ?  Palpations: Abdomen is soft.  ?Neurological:  ?   Comments: Somnolent  ? ? ?Vital Signs: BP 113/74 (BP Location: Left Arm)   Pulse 78   Temp 98.2 ?F (36.8 ?C)   Resp 18   Ht '6\' 3"'  (1.905 m)   Wt 97.5 kg   SpO2 91%   BMI 26.87 kg/m?  ?Pain Scale: PAINAD ?POSS *See Group Information*: S-Acceptable,Sleep, easy to arouse ?Pain Score: 0-No pain ? ? ?SpO2: SpO2: 91 % ?O2 Device:SpO2: 91 % ?O2 Flow Rate: .  ? ?IO: Intake/output summary:  ?Intake/Output Summary (Last 24 hours) at 12/17/2021 0556 ?Last data filed at 12/16/2021 1714 ?Gross per 24 hour  ?Intake 770 ml  ?Output --  ?Net 770 ml  ? ? ?LBM: Last BM Date : 12/15/21 ?Baseline Weight: Weight: 97.5 kg ?Most recent weight: Weight: 97.5 kg     ?Palliative Assessment/Data: ? ? ? ?Time Total: 55 min ? ?Greater than 50%  of this time was spent counseling and coordinating care related to the above assessment and plan. ? ?Signed by: ?Vinie Sill, NP ?Palliative Medicine Team ?Pager # [phone] (M-F 8a-5p) ?Team Phone # [phone] (Nights/Weekends) ?  ? ? ? ? ? ? ? ? ? ? ? ? ?  ?

## 2021-12-18 MED ORDER — DOXYCYCLINE HYCLATE 100 MG PO TABS
100.0000 mg | ORAL_TABLET | Freq: Two times a day (BID) | ORAL | Status: DC
  Administered 2021-12-18 – 2021-12-19 (×3): 100 mg via ORAL
  Filled 2021-12-18 (×3): qty 1

## 2021-12-18 MED ORDER — HALOPERIDOL LACTATE 5 MG/ML IJ SOLN
1.0000 mg | Freq: Four times a day (QID) | INTRAMUSCULAR | Status: DC | PRN
  Administered 2021-12-19: 1 mg via INTRAVENOUS
  Filled 2021-12-18: qty 1

## 2021-12-18 MED ORDER — QUETIAPINE FUMARATE 25 MG PO TABS
12.5000 mg | ORAL_TABLET | Freq: Every day | ORAL | Status: DC
  Administered 2021-12-18: 12.5 mg via ORAL
  Filled 2021-12-18: qty 1

## 2021-12-18 NOTE — Progress Notes (Signed)
? ?                                                                                                                                                     ?                                                   ?Daily Progress Note  ? ?Patient Name: Walter [name]       Date: 12/18/2021 ?DOB: 02-Aug-1951  Age: 71 y.o. MRN#: [id number] ?Attending Physician: Charlynne Cousins, MD ?Primary Care Physician: Mikey Kirschner, PA-C ?Admit Date: 12/13/2021 ? ?Reason for Consultation/Follow-up: Establishing goals of care ? ?Subjective: ?I met today with Walter [name] wife and son.  ? ?I introduced palliative care as specialized medical care for people living with serious illness. It focuses on providing relief from the symptoms and stress of a serious illness. The goal is to improve quality of life for both the patient and the family. ? ?We discussed clinical course as well as wishes moving forward in regard to advanced directives.  Concepts specific to code status, care plan this hospitalization, and rehospitalization discussed.  Values and goals of care important to patient and family were attempted to be elicited. ? ?We discussed that in light of multiple chronic medical problems that have worsened with this acute problem, care should be focused on interventions that are likely to allow Walter [name] to achieve goal of getting back to facility and spending time with family.  We reviewed a MOST form as a way to outline wishes for care in the future and discussed how to develop plan of care to focus on continuing therapies that would maximize chance of being well enough to return to facility and limiting therapies not in line with this goal.  We discussed heroic interventions at end-of-life and low likelihood this would result in him recovering to meaningful baseline if he were to suffer from cardiac or respiratory arrest. ? ?His wife expresses, " I am not sure he would want that" during discussion of CPR mechanical ventilation, but at the  same time his son and wife report they need time to consider. ?  ?Concept of Hospice and Palliative Care were discussed ?  ?Questions and concerns addressed.   PMT will continue to support holistically. ? ?Length of Stay: 3 ? ?Current Medications: ?Scheduled Meds:  ? Chlorhexidine Gluconate Cloth  6 each Topical Q0600  ? divalproex  250 mg Oral Q12H  ? doxycycline  100 mg Oral Q12H  ? mupirocin ointment  1 application. Nasal BID  ? QUEtiapine  12.5 mg Oral QHS  ? rivastigmine  1.5 mg Oral BID WC  ? ? ?Continuous  Infusions: ? ? ?PRN Meds: ?acetaminophen **OR** acetaminophen, haloperidol lactate, HYDROcodone-acetaminophen ? ?Physical Exam         ?General: Does not interact during my encounter ?HEENT: No bruits, no goiter, no JVD ?Heart: Regular rate and rhythm. No murmur appreciated. ?Lungs: Fair air movement, clear ?Abdomen: Soft, nontender, nondistended, positive bowel sounds.   ?Ext: No significant edema ?Skin: Warm and dry ? ? ?Vital Signs: BP 137/70   Pulse 84   Temp 97.7 ?F (36.5 ?C)   Resp 18   Ht $R'6\' 3"'nM$  (1.905 m)   Wt 97.5 kg   SpO2 100%   BMI 26.87 kg/m?  ?SpO2: SpO2: 100 % ?O2 Device: O2 Device: Room Air ?O2 Flow Rate:   ? ?Intake/output summary:  ?Intake/Output Summary (Last 24 hours) at 12/18/2021 1855 ?Last data filed at 12/18/2021 1800 ?Gross per 24 hour  ?Intake 1905.15 ml  ?Output 600 ml  ?Net 1305.15 ml  ? ?LBM: Last BM Date : 12/15/21 ?Baseline Weight: Weight: 97.5 kg ?Most recent weight: Weight: 97.5 kg ? ?     ?Palliative Assessment/Data: ? ? ? ? ? ?Patient Active Problem List  ? Diagnosis Date Noted  ? UTI (urinary tract infection) 12/13/2021  ? Cellulitis 12/14/2021  ? Sepsis ([place]) 12/14/2021  ? Dementia with behavioral disturbance 12/13/2021  ? Disease of prostate 01/13/2016  ? Snores 01/13/2016  ? Hyperlipidemia 01/13/2016  ? 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 Colon, diverticulosis 12/13/2006   ? ? ?Palliative Care Assessment & Plan  ? ?Patient Profile: ?71 y.o. male  with past medical history of Lewy body dementia, hypertension, BPH admitted on 12/13/2021 from [facility] memory care with fever and concern for cellulitis infection of wound (possible spider bite 12/10/21) been treated with doxycycline. Hospitalization has been complicated by agitation with some improvement with Depakote and UTI. Advance Directive reviewed in electronic chart.  ? ?Recommendations/Plan: ?Full code/full scope ?Initial conversations regarding advanced care planning and long-term goals of care.  Family given copy of MOST form and copy of hard choices for loving people to review.  Plan for follow-up in a couple of days to review questions and continue conversation.  If he is discharged prior to this, would recommend outpatient palliative care. ? ?Goals of Care and Additional Recommendations: ?Limitations on Scope of Treatment: Full Scope Treatment ? ?Code Status: ? ?  ?Code Status Orders  ?(From admission, onward)  ?  ? ? ?  ? ?  Start     Ordered  ? 12/13/21 2312  Full code  Continuous       ? 12/13/21 2316  ? ?  ?  ? ?  ? ?Code Status History   ? ? This patient has a current code status but no historical code status.  ? ?  ? ?Advance Directive Documentation   ? ?Flowsheet Row Most Recent Value  ?Type of Advance Directive Living will, Healthcare Power of Attorney  ?Pre-existing out of facility DNR order (yellow form or pink MOST form) --  ?"MOST" Form in Place? --  ? ?  ? ? ?Prognosis: ? Unable to determine ? ?Discharge Planning: ?[place] for rehab with Palliative care service follow-up ? ?Care plan was discussed with wife, but son ? ?Thank you for allowing the Palliative Medicine Team to assist in the care of this patient. ? ? ?Total Time 60 Prolonged Time Billed No  ? ?Micheline Rough, MD ? ?Please contact Palliative Medicine  Team phone at [phone] for questions and concerns.  ? ? ? ? ? ?

## 2021-12-18 NOTE — TOC Progression Note (Signed)
Transition of Care (TOC) - Progression Note  ? ? ?Patient Details  ?Name: Walter [name] ?MRN: [id number] ?Date of Birth: [birth year] ? ?Transition of Care (TOC) CM/SW Contact  ?Tawanna Cooler, RN ?Phone Number: ?12/18/2021, 12:12 PM ? ?Clinical Narrative:    ? ?Physician states patient may be ready to discharge back to [facility] on Saturday.  [place] and spoke with Mudlogger, Pamala Hurry.  She states there isn't anyone on Saturday or Sunday that will be able to accept patient.  Will need to return Mon-Fri.   ? ? ?Expected Discharge Plan: Memory Care ?Barriers to Discharge: Continued Medical Work up ? ?Expected Discharge Plan and Services ?Expected Discharge Plan: Memory Care ?  ?Discharge Planning Services: CM Consult ?Post Acute Care Choice:  ([facility]-memory care) ?Living arrangements for the past 2 months:  (facility-[facility]) ?                ?  ?  ?

## 2021-12-18 NOTE — Progress Notes (Signed)
[facility] ?PROGRESS NOTE ? ? ? ?Progress Note  ?Walter [name]  [id number] DOB: [birth year] DOA: 12/13/2021 ?PCP: Mikey Kirschner, PA-C  ? ? ? ?Brief Narrative:  ? ?Walter [name] is an 71 y.o. male past medical history significant for dementia with behavioral disturbances BPH essential hypertension comes in with fever and possible infection ? ? ? ?Assessment/Plan:  ? ?Left arm cellulitis: ?Discontinue vancomycin, changed to oral doxycycline. ?Out of bed to chair consult, Occupational Therapy saw the patient recommended back to skilled nursing facility. ?Palliative Care to meet with family today to discuss goals of care. ? ?Possible UTI (urinary tract infection) ?Urine culture grew less than 80,000 colonies of MRSA likely a contaminant blood cultures have remained negative till date. ? ?Dementia with behavioral disturbance ?Agitated overnight again and confused. ?Currently on mittens, takes Depakote twice a day IV held on IV Seroquel. ? ?DVT prophylaxis: lovenox ?Family Communication:none ?Status is: Inpatient ?Remains inpatient appropriate because: Left arm cellulitis ? ? ? ?Code Status:  ? ?  ?Code Status Orders  ?(From admission, onward)  ?  ? ? ?  ? ?  Start     Ordered  ? 12/13/21 2312  Full code  Continuous       ? 12/13/21 2316  ? ?  ?  ? ?  ? ?Code Status History   ? ? This patient has a current code status but no historical code status.  ? ?  ? ?Advance Directive Documentation   ? ?Flowsheet Row Most Recent Value  ?Type of Advance Directive Living will, Healthcare Power of Attorney  ?Pre-existing out of facility DNR order (yellow form or pink MOST form) --  ?"MOST" Form in Place? --  ? ?  ? ? ? ? ?IV Access:  ? ?Peripheral IV ? ? ?Procedures and diagnostic studies:  ? ?No results found. ? ? ?Medical Consultants:  ? ?None. ? ? ?Subjective:  ? ? ?Walter [name] sleepy this morning again moaning and groaning ? ?Objective:  ? ? ?Vitals:  ? 12/17/21 0455 12/17/21 1310 12/17/21 2017 12/18/21 0439  ?BP:  113/74 (!) 115/55 (!) 136/53 132/81  ?Pulse: 78  69 75  ?Resp: 18 18 16 14   ?Temp: 98.2 ?F (36.8 ?C) 100 ?F (37.8 ?C) 98.1 ?F (36.7 ?C) 97.8 ?F (36.6 ?C)  ?TempSrc:  Axillary    ?SpO2: 91% 96% 100% 99%  ?Weight:      ?Height:      ? ?SpO2: 99 % ? ? ?Intake/Output Summary (Last 24 hours) at 12/18/2021 0818 ?Last data filed at 12/18/2021 0700 ?Gross per 24 hour  ?Intake --  ?Output 300 ml  ?Net -300 ml  ? ? ?Filed Weights  ? 12/14/21 0215  ?Weight: 97.5 kg  ? ? ?Exam: ?General exam: In no acute distress. ?Respiratory system: Good air movement and clear to auscultation. ?Cardiovascular system: S1 & S2 heard, RRR. No JVD. ?Gastrointestinal system: Abdomen is nondistended, soft and nontender.  ?Extremities: No pedal edema. ?Skin: No rashes, lesions or ulcers ?Psychiatry: Sleepy this morning. ? ? ?Data Reviewed:  ? ? ?Labs: ?Basic Metabolic Panel: ?Recent Labs  ?Lab 12/13/21 ?1911 12/14/21 ?[id number] 12/15/21 ?[id number] 12/17/21 ?0749  ?NA 135 134* 137  --   ?K 3.6 3.5 3.9  --   ?CL 100 101 103  --   ?CO2 25 24 28   --   ?GLUCOSE 104* 102* 100*  --   ?BUN 23 15 11   --   ?CREATININE 0.62 0.60* 0.65 0.56*  ?CALCIUM 8.9 8.3*  8.6*  --   ?MG  --  2.0  --   --   ?PHOS  --  2.8  --   --   ? ? ?GFR ?Estimated Creatinine Clearance: 101.2 mL/min (A) (by C-G formula based on SCr of 0.56 mg/dL (L)). ?Liver Function Tests: ?Recent Labs  ?Lab 12/13/21 ?1911 12/14/21 ?[id number] 12/15/21 ?0446  ?AST 28 23  24 22   ?ALT 26 23  23 23   ?ALKPHOS 79 66  66 67  ?BILITOT 1.2 0.9  1.0 0.7  ?PROT 6.9 6.4*  6.2* 6.7  ?ALBUMIN 3.3* 3.0*  2.9* 2.9*  ? ? ?No results for input(s): LIPASE, AMYLASE in the last 168 hours. ?No results for input(s): AMMONIA in the last 168 hours. ?Coagulation profile ?Recent Labs  ?Lab 12/13/21 ?1911  ?INR 1.2  ? ? ?COVID-19 Labs ? ?No results for input(s): DDIMER, FERRITIN, LDH, CRP in the last 72 hours. ? ?Lab Results  ?Component Value Date  ? [place] NEGATIVE 12/13/2021  ? ? ?CBC: ?Recent Labs  ?Lab 12/13/21 ?1911 12/14/21 ?[id number]  12/15/21 ?0446  ?WBC 11.1* 9.9 8.7  ?NEUTROABS 8.3* 8.0*  --   ?HGB 12.7* 12.2* 12.7*  ?HCT 37.5* 35.9* 37.2*  ?MCV 95.2 95.0 94.4  ?PLT 199 182 201  ? ? ?Cardiac Enzymes: ?Recent Labs  ?Lab 12/14/21 ?0833  ?CKTOTAL 199  ? ? ?BNP (last 3 results) ?No results for input(s): PROBNP in the last 8760 hours. ?CBG: ?No results for input(s): GLUCAP in the last 168 hours. ?D-Dimer: ?No results for input(s): DDIMER in the last 72 hours. ?Hgb A1c: ?No results for input(s): HGBA1C in the last 72 hours. ?Lipid Profile: ?No results for input(s): CHOL, HDL, LDLCALC, TRIG, CHOLHDL, LDLDIRECT in the last 72 hours. ?Thyroid function studies: ?No results for input(s): TSH, T4TOTAL, T3FREE, THYROIDAB in the last 72 hours. ? ?Invalid input(s): FREET3 ? ?Anemia work up: ?No results for input(s): VITAMINB12, FOLATE, FERRITIN, TIBC, IRON, RETICCTPCT in the last 72 hours. ?Sepsis Labs: ?Recent Labs  ?Lab 12/13/21 ?1827 12/13/21 ?1910 12/13/21 ?1911 12/14/21 ?[id number] 12/15/21 ?0446  ?PROCALCITON  --   --   --  <0.10  --   ?WBC  --   --  11.1* 9.9 8.7  ?LATICACIDVEN 0.8 1.2  --   --   --   ? ? ?Microbiology ?Recent Results (from the past 240 hour(s))  ?Blood Culture (routine x 2)     Status: None (Preliminary result)  ? Collection Time: 12/13/21  6:15 PM  ? Specimen: BLOOD  ?Result Value Ref Range Status  ? Specimen Description   Final  ?  BLOOD RIGHT ARM ?Performed at [facility], [place] [address], [place], [place] [address] ?  ? Special Requests   Final  ?  BOTTLES DRAWN AEROBIC AND ANAEROBIC Blood Culture adequate volume ?Performed at [facility], [place] [address] [place], [place] [address] ?  ? Culture   Final  ?  NO GROWTH 4 DAYS ?Performed at [facility], [place] [address], [place], [place] [address] ?  ? Report Status PENDING  Incomplete  ?Resp Panel by RT-PCR (Flu A&B, Covid) Nasopharyngeal Swab     Status: None  ? Collection Time: 12/13/21  7:11 PM  ? Specimen: Nasopharyngeal Swab;  Nasopharyngeal(NP) swabs in vial transport medium  ?Result Value Ref Range Status  ? SARS Coronavirus 2 by RT PCR NEGATIVE NEGATIVE Final  ?  Comment: (NOTE) ?SARS-CoV-2 target nucleic acids are NOT DETECTED. ? ?The SARS-CoV-2 RNA is generally  detectable in upper respiratory ?specimens during the acute phase of infection. The lowest ?concentration of SARS-CoV-2 viral copies this assay can detect is ?138 copies/mL. A negative result does not preclude SARS-Cov-2 ?infection and should not be used as the sole basis for treatment or ?other patient management decisions. A negative result may occur with  ?improper specimen collection/handling, submission of specimen other ?than nasopharyngeal swab, presence of viral mutation(s) within the ?areas targeted by this assay, and inadequate number of viral ?copies(<138 copies/mL). A negative result must be combined with ?clinical observations, patient history, and epidemiological ?information. The expected result is Negative. ? ?Fact Sheet for Patients:  ?EntrepreneurPulse.com.au ? ?Fact Sheet for Healthcare Providers:  ?IncredibleEmployment.be ? ?This test is no t yet approved or cleared by the [place] FDA and  ?has been authorized for detection and/or diagnosis of SARS-CoV-2 by ?FDA under an Emergency Use Authorization (EUA). This EUA will remain  ?in effect (meaning this test can be used) for the duration of the ?COVID-19 declaration under Section 564(b)(1) of the Act, 21 ?U.S.C.section 360bbb-3(b)(1), unless the authorization is terminated  ?or revoked sooner.  ? ? ?  ? Influenza A by PCR NEGATIVE NEGATIVE Final  ? Influenza B by PCR NEGATIVE NEGATIVE Final  ?  Comment: (NOTE) ?The Xpert Xpress SARS-CoV-2/FLU/RSV plus assay is intended as an aid ?in the diagnosis of influenza from Nasopharyngeal swab specimens and ?should not be used as a sole basis for treatment. Nasal washings and ?aspirates are unacceptable for Xpert Xpress  SARS-CoV-2/FLU/RSV ?testing. ? ?Fact Sheet for Patients: ?EntrepreneurPulse.com.au ? ?Fact Sheet for Healthcare Providers: ?IncredibleEmployment.be ? ?This test is not yet approved or cleared

## 2021-12-18 NOTE — Plan of Care (Signed)
Walter [name] was very drowsy and difficult to keep awake this morning. He moaned with any attempt at care and woke up enough to take his morning meds with applesauce and had sips of water and OJ. Staff got Walter [name] up to the chair in the early afternoon but he continually attempted to get up out of the chair despite distraction techniques.  He was assisted back to bed due to concerns for his safety leaving him alone in the chair even with chair alarm in place. He continues to have a poor appetite.  He had only a few bites of his lunch but drink a fair amount of water.  ?Problem: Clinical Measurements: ?Goal: Ability to avoid or minimize complications of infection will improve ?Outcome: Progressing ?  ?Problem: Skin Integrity: ?Goal: Skin integrity will improve ?Outcome: Progressing ?  ?Problem: Education: ?Goal: Knowledge of General Education information will improve ?Description: Including pain rating scale, medication(s)/side effects and non-pharmacologic comfort measures ?Outcome: Progressing ?  ?Problem: Health Behavior/Discharge Planning: ?Goal: Ability to manage health-related needs will improve ?Outcome: Progressing ?  ?Problem: Clinical Measurements: ?Goal: Ability to maintain clinical measurements within normal limits will improve ?Outcome: Progressing ?Goal: Will remain free from infection ?Outcome: Progressing ?Goal: Diagnostic test results will improve ?Outcome: Progressing ?Goal: Respiratory complications will improve ?Outcome: Progressing ?Goal: Cardiovascular complication will be avoided ?Outcome: Progressing ?  ?Problem: Activity: ?Goal: Risk for activity intolerance will decrease ?Outcome: Progressing ?  ?Problem: Nutrition: ?Goal: Adequate nutrition will be maintained ?Outcome: Progressing ?  ?Problem: Coping: ?Goal: Level of anxiety will decrease ?Outcome: Progressing ?  ?Problem: Elimination: ?Goal: Will not experience complications related to bowel motility ?Outcome: Progressing ?Goal: Will not  experience complications related to urinary retention ?Outcome: Progressing ?  ?Problem: Pain Managment: ?Goal: General experience of comfort will improve ?Outcome: Progressing ?  ?Problem: Safety: ?Goal: Ability to remain free from injury will improve ?Outcome: Progressing ?  ?Problem: Skin Integrity: ?Goal: Risk for impaired skin integrity will decrease ?Outcome: Progressing ?  ?Problem: Safety: ?Goal: Non-violent Restraint(s) ?Outcome: Progressing ?  ?

## 2021-12-19 LAB — CULTURE, BLOOD (ROUTINE X 2)
Culture: NO GROWTH
Culture: NO GROWTH
Special Requests: ADEQUATE
Special Requests: ADEQUATE

## 2021-12-19 MED ORDER — DOXYCYCLINE HYCLATE 100 MG PO TABS: 100.0000 mg | ORAL_TABLET | Freq: Two times a day (BID) | ORAL | Status: AC

## 2021-12-19 MED ORDER — HYDROCODONE-ACETAMINOPHEN 5-325 MG PO TABS: 1.0000 | ORAL_TABLET | ORAL | 0 refills | Status: AC | PRN

## 2021-12-19 NOTE — Plan of Care (Signed)
?  Problem: Clinical Measurements: ?Goal: Ability to avoid or minimize complications of infection will improve ?Outcome: Adequate for Discharge ?  ?Problem: Skin Integrity: ?Goal: Skin integrity will improve ?Outcome: Adequate for Discharge ?  ?Problem: Education: ?Goal: Knowledge of General Education information will improve ?Description: Including pain rating scale, medication(s)/side effects and non-pharmacologic comfort measures ?Outcome: Adequate for Discharge ?  ?Problem: Health Behavior/Discharge Planning: ?Goal: Ability to manage health-related needs will improve ?Outcome: Adequate for Discharge ?  ?Problem: Clinical Measurements: ?Goal: Ability to maintain clinical measurements within normal limits will improve ?Outcome: Adequate for Discharge ?Goal: Will remain free from infection ?Outcome: Adequate for Discharge ?  ?Problem: Activity: ?Goal: Risk for activity intolerance will decrease ?Outcome: Adequate for Discharge ?  ?Problem: Nutrition: ?Goal: Adequate nutrition will be maintained ?Outcome: Adequate for Discharge ?  ?Problem: Coping: ?Goal: Level of anxiety will decrease ?Outcome: Adequate for Discharge ?  ?Problem: Skin Integrity: ?Goal: Risk for impaired skin integrity will decrease ?Outcome: Adequate for Discharge ?  ?

## 2021-12-19 NOTE — Discharge Summary (Addendum)
Physician Discharge Summary  ?Walter [name] ZJQ:[id number] DOB: Oct 21, 1950 DOA: 12/13/2021 ? ?PCP: Mikey Kirschner, PA-C ? ?Admit date: 12/13/2021 ?Discharge date: 12/19/2021 ? ?Admitted From: memory care unit ?Disposition:  Back to memory unit( ? ?Recommendations for Outpatient Follow-up:  ?Follow up with PCP in 1-2 weeks ? ? ?Home Health:No ?Equipment/Devices:None ?Discharge Condition:Stable ?CODE STATUS:Full ?Diet recommendation: Heart Healthy  ? ?Brief/Interim Summary: ?71 y.o. male past medical history significant for dementia with behavioral disturbances BPH essential hypertension comes in with fever and possible infection ? ?Discharge Diagnoses:  ?Principal Problem: ?  UTI (urinary tract infection) ?Active Problems: ?  Dementia with behavioral disturbance ?  Cellulitis ?  Sepsis ([place]) ? ?Left arm cellulitis: ?He was started on IV vancomycin once he defervesced he was transitioned to oral doxycycline. ?Physical therapy evaluated the patient, he will go back to his memory care unit. ?Plan of care met with family they would like to continue f treatment, he remains a full code. ?We will continue doxycycline as an outpatient. ? ?Dementia with behavioral disturbances: ?Agitated overnight likely acute confusional state. ?He was continued on Depakote has not required Haldol Seroquel over the last 24 hours prior to discharge. ? ?Probable UTI: ?Less than 100,000 colonies UTI was ruled out. ? ?Discharge Instructions ? ?Discharge Instructions   ? ? Diet - low sodium heart healthy   Complete by: As directed ?  ? Increase activity slowly   Complete by: As directed ?  ? No wound care   Complete by: As directed ?  ? ?  ? ?Allergies as of 12/19/2021   ? ?   Reactions  ? Antihistamines, Chlorpheniramine-type   ? Not documented on MAR  ? ?  ? ?  ?Medication List  ?  ? ?TAKE these medications   ? ?acetaminophen 500 MG tablet ?Commonly known as: TYLENOL ?Take 500 mg by mouth every 6 (six) hours as needed for mild pain, fever or  headache. ?  ?alum & mag hydroxide-simeth 200-200-20 MG/5ML suspension ?Commonly known as: MAALOX/MYLANTA ?Take 30 mLs by mouth every 6 (six) hours as needed for indigestion or heartburn. ?  ?divalproex 125 MG capsule ?Commonly known as: DEPAKOTE SPRINKLE ?Take 125 mg by mouth daily. At 2 PM FOR SUNDOWNING BEHAVIORS ?  ?doxycycline 100 MG tablet ?Commonly known as: VIBRA-TABS ?Take 1 tablet (100 mg total) by mouth every 12 (twelve) hours. ?What changed: when to take this ?  ?erythromycin ophthalmic ointment ?Place a 1/2 inch ribbon of ointment into the lower eyelid every 4 hours. ?  ?guaiFENesin 100 MG/5ML liquid ?Commonly known as: ROBITUSSIN ?Take 10 mLs by mouth every 6 (six) hours as needed for cough or to loosen phlegm. ?  ?HYDROcodone-acetaminophen 5-325 MG tablet ?Commonly known as: NORCO/VICODIN ?Take 1 tablet by mouth every 4 (four) hours as needed. ?  ?loperamide 2 MG capsule ?Commonly known as: IMODIUM ?Take 2 mg by mouth daily as needed for diarrhea or loose stools. ?  ?magnesium hydroxide 400 MG/5ML suspension ?Commonly known as: MILK OF MAGNESIA ?Take 30 mLs by mouth daily as needed for mild constipation. ?  ?Menthol-Zinc Oxide 0.44-20.625 % Oint ?Apply 1 application topically 3 (three) times daily. Each shift and any incontinence episode. ?  ?neomycin-bacitracin-polymyxin [phone] ointment ?Apply 1 application. topically as needed (for minor skin tears or abrasions). ?  ?Nutritional Shake Liqd ?Take 1 Can by mouth 3 (three) times daily. ?  ?oxymetazoline 0.05 % nasal spray ?Commonly known as: Afrin Nasal Spray ?Place 1 spray into both nostrils 2 (two) times daily.  As needed for sinus congestion.  Do not use for more than 3 days. ?  ?QUEtiapine 25 MG tablet ?Commonly known as: SEROQUEL ?Take 12.5 mg by mouth at bedtime. ?  ?rivastigmine 1.5 MG capsule ?Commonly known as: EXELON ?Take 1.5 mg by mouth 2 (two) times daily with a meal. ?  ? ?  ? ? ?Allergies  ?Allergen Reactions  ? Antihistamines,  Chlorpheniramine-Type   ?  Not documented on MAR  ? ? ?Consultations: ?Palliative care ? ? ?Procedures/Studies: ?DG Elbow Complete Left ? ?Result Date: 12/13/2021 ?CLINICAL DATA:  Possible sepsis. Insect bite with redness and swelling. EXAM: LEFT ELBOW - COMPLETE 3+ VIEW COMPARISON:  None. FINDINGS: There is no evidence of fracture, dislocation, or joint effusion. Mild spurring is noted at the olecranon. Enthesopathic changes are noted at the medial epicondyle. Soft tissue swelling is present about the elbow. No radiopaque foreign body is identified. IMPRESSION: No acute osseous abnormality or radiopaque foreign body. Electronically Signed   By: Brett Fairy M.D.   On: 12/13/2021 22:38  ? ?DG Chest Port 1 View ? ?Result Date: 12/13/2021 ?CLINICAL DATA:  Questionable sepsis. EXAM: PORTABLE CHEST 1 VIEW COMPARISON:  Chest x-ray 06/25/2011. FINDINGS: The heart size and mediastinal contours are within normal limits. Both lungs are clear. The visualized skeletal structures are unremarkable. IMPRESSION: No active disease. Electronically Signed   By: Ronney Asters M.D.   On: 12/13/2021 21:33   ?(Echo, Carotid, EGD, Colonoscopy, ERCP)  ? ? ?Subjective: ?No complaints ? ?Discharge Exam: ?Vitals:  ? 12/18/21 2021 12/19/21 0358  ?BP: (!) 150/58 (!) 162/63  ?Pulse: 76 93  ?Resp: 14 18  ?Temp: 97.8 ?F (36.6 ?C) (!) 97.4 ?F (36.3 ?C)  ?SpO2: 97% 98%  ? ?Vitals:  ? 12/18/21 0439 12/18/21 1146 12/18/21 2021 12/19/21 0358  ?BP: 132/81 137/70 (!) 150/58 (!) 162/63  ?Pulse: 75 84 76 93  ?Resp: '14 18 14 18  ' ?Temp: 97.8 ?F (36.6 ?C) 97.7 ?F (36.5 ?C) 97.8 ?F (36.6 ?C) (!) 97.4 ?F (36.3 ?C)  ?TempSrc:   Oral Oral  ?SpO2: 99% 100% 97% 98%  ?Weight:      ?Height:      ? ? ?General: Pt is alert, awake, not in acute distress ?Cardiovascular: RRR, S1/S2 +, no rubs, no gallops ?Respiratory: CTA bilaterally, no wheezing, no rhonchi ?Abdominal: Soft, NT, ND, bowel sounds + ?Extremities: no edema, no cyanosis ? ? ? ?The results of significant  diagnostics from this hospitalization (including imaging, microbiology, ancillary and laboratory) are listed below for reference.   ? ? ?Microbiology: ?Recent Results (from the past 240 hour(s))  ?Blood Culture (routine x 2)     Status: None  ? Collection Time: 12/13/21  6:15 PM  ? Specimen: BLOOD  ?Result Value Ref Range Status  ? Specimen Description   Final  ?  BLOOD RIGHT ARM ?Performed at [facility], [place] [address] [place], [place] [address] ?  ? Special Requests   Final  ?  BOTTLES DRAWN AEROBIC AND ANAEROBIC Blood Culture adequate volume ?Performed at [facility], [place] [address] [place], [place] [address] ?  ? Culture   Final  ?  NO GROWTH 5 DAYS ?Performed at [facility], [place] [address] [place], [place] [address] ?  ? Report Status 12/19/2021 FINAL  Final  ?Resp Panel by RT-PCR (Flu A&B, Covid) Nasopharyngeal Swab     Status: None  ? Collection Time: 12/13/21  7:11 PM  ? Specimen: Nasopharyngeal Swab; Nasopharyngeal(NP)  swabs in vial transport medium  ?Result Value Ref Range Status  ? SARS Coronavirus 2 by RT PCR NEGATIVE NEGATIVE Final  ?  Comment: (NOTE) ?SARS-CoV-2 target nucleic acids are NOT DETECTED. ? ?The SARS-CoV-2 RNA is generally detectable in upper respiratory ?specimens during the acute phase of infection. The lowest ?concentration of SARS-CoV-2 viral copies this assay can detect is ?138 copies/mL. A negative result does not preclude SARS-Cov-2 ?infection and should not be used as the sole basis for treatment or ?other patient management decisions. A negative result may occur with  ?improper specimen collection/handling, submission of specimen other ?than nasopharyngeal swab, presence of viral mutation(s) within the ?areas targeted by this assay, and inadequate number of viral ?copies(<138 copies/mL). A negative result must be combined with ?clinical observations, patient history, and epidemiological ?information. The expected result is  Negative. ? ?Fact Sheet for Patients:  ?EntrepreneurPulse.com.au ? ?Fact Sheet for Healthcare Providers:  ?IncredibleEmployment.be ? ?This test is no t yet approved or cleared by the [facility]

## 2021-12-19 NOTE — NC FL2 (Addendum)
?[place] MEDICAID FL2 LEVEL OF CARE SCREENING TOOL  ?  ? ?IDENTIFICATION  ?Patient Name: ?Walter [name] Birthdate: 25-Jan-1951 Sex: male Admission Date (Current Location): ?12/13/2021  ?[place] and [place] Number: ? [place] ?  Facility and Address:  ?[facility],  [place], [place] ?     Provider Number: ?[id number]  ?Attending Physician Name and Address:  ?Charlynne Cousins, MD ? Relative Name and Phone Number:  ?Gurdon, Schwoerer Spouse   [phone]  [facility] Relative  [phone]  Ja, Dellaquila   [phone] ?   ?Current Level of Care: ?Hospital Recommended Level of Care: ?[place], [facility] ([place]) Prior Approval Number: ?  ? ?Date Approved/Denied: ?  PASRR Number: ?  ? ?Discharge Plan: ?Domiciliary (Rest home) ([facility] memory care.) ?  ? ?Current Diagnoses: ?Patient Active Problem List  ? Diagnosis Date Noted  ? Cellulitis 12/14/2021  ? Sepsis ([place]) 12/14/2021  ? UTI (urinary tract infection) 12/13/2021  ? Dementia with behavioral disturbance 12/13/2021  ? Disease of prostate 01/13/2016  ? Snores 01/13/2016  ? Hyperlipidemia 01/13/2016  ? 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 Colon, diverticulosis 12/13/2006  ? ? ?Orientation RESPIRATION BLADDER Height & Weight   ?  ?Self ? Normal Incontinent Weight: 214 lb 15.2 oz (97.5 kg) ?Height:  6\' 3"  (190.5 cm)  ?BEHAVIORAL SYMPTOMS/MOOD NEUROLOGICAL BOWEL NUTRITION STATUS  ?    Incontinent Diet (Regular diet)  ?AMBULATORY STATUS COMMUNICATION OF NEEDS Skin   ?Limited Assist Verbally Skin abrasions ?  ?  ?  ?    ?     ?     ? ? ?Personal Care Assistance Level of Assistance  ?Bathing, Dressing Bathing Assistance: Limited assistance ?  ?Dressing Assistance: Limited assistance ?   ? ?Functional Limitations Info  ?Hearing, Speech, Sight Sight Info: Adequate ?Hearing Info:  Adequate ?Speech Info: Adequate  ? ? ?SPECIAL CARE FACTORS FREQUENCY  ?    ?  ?  ?  ?  ?  ?  ?   ? ? ?Contractures Contractures Info: Not present  ? ? ?Additional Factors Info  ?Code Status, Allergies, Psychotropic Code Status Info: Full Code ?Allergies Info: Antihistamines, Chlorpheniramine-type ?Psychotropic Info: divalproex (DEPAKOTE SPRINKLE) capsule 250 mg, QUEtiapine (SEROQUEL) tablet 12.5 mg ?  ?  ?   ? ?Current Medications (12/19/2021):  This is the current hospital active medication list ?Current Facility-Administered Medications  ?Medication Dose Route Frequency Provider Last Rate Last Admin  ? acetaminophen (TYLENOL) tablet 650 mg  650 mg Oral Q6H PRN Doutova, Anastassia, MD      ? Or  ? acetaminophen (TYLENOL) suppository 650 mg  650 mg Rectal Q6H PRN Doutova, Anastassia, MD      ? Chlorhexidine Gluconate Cloth 2 % PADS 6 each  6 each Topical [id number] Oswald Hillock, MD   6 each at 12/19/21 [phone]  ? divalproex (DEPAKOTE SPRINKLE) capsule 250 mg  250 mg Oral Q12H Oswald Hillock, MD   250 mg at 12/19/21 0935  ? doxycycline (VIBRA-TABS) tablet 100 mg  100 mg Oral Q12H Charlynne Cousins, MD   100 mg at 12/19/21 0930  ? haloperidol lactate (HALDOL) injection 1 mg  1 mg Intravenous Q6H PRN Charlynne Cousins, MD   1 mg at 12/19/21 0402  ? HYDROcodone-acetaminophen (NORCO/VICODIN) 5-325 MG per tablet 1-2 tablet  1-2 tablet Oral Q4H PRN Toy Baker, MD      ?  mupirocin ointment (BACTROBAN) 2 % 1 application.  1 application. Nasal BID Oswald Hillock, MD   1 application. at 12/18/21 2344  ? QUEtiapine (SEROQUEL) tablet 12.5 mg  12.5 mg Oral QHS Charlynne Cousins, MD   12.5 mg at 12/18/21 2119  ? rivastigmine (EXELON) capsule 1.5 mg  1.5 mg Oral BID WC Doutova, Anastassia, MD   1.5 mg at 12/19/21 0944  ? ? ? ?Discharge Medications: ?TAKE these medications   ?  ?acetaminophen 500 MG tablet ?Commonly known as: TYLENOL ?Take 500 mg by mouth every 6 (six) hours as needed for mild pain, fever or headache. ?    ?alum & mag hydroxide-simeth 200-200-20 MG/5ML suspension ?Commonly known as: MAALOX/MYLANTA ?Take 30 mLs by mouth every 6 (six) hours as needed for indigestion or heartburn. ?   ?divalproex 125 MG capsule ?Commonly known as: DEPAKOTE SPRINKLE ?Take 125 mg by mouth daily. At 2 PM FOR SUNDOWNING BEHAVIORS ?   ?doxycycline 100 MG tablet ?Commonly known as: VIBRA-TABS ?Take 1 tablet (100 mg total) by mouth every 12 (twelve) hours. ?What changed: when to take this ?   ?erythromycin ophthalmic ointment ?Place a 1/2 inch ribbon of ointment into the lower eyelid every 4 hours. ?   ?guaiFENesin 100 MG/5ML liquid ?Commonly known as: ROBITUSSIN ?Take 10 mLs by mouth every 6 (six) hours as needed for cough or to loosen phlegm. ?   ?HYDROcodone-acetaminophen 5-325 MG tablet ?Commonly known as: NORCO/VICODIN ?Take 1 tablet by mouth every 4 (four) hours as needed. ?   ?loperamide 2 MG capsule ?Commonly known as: IMODIUM ?Take 2 mg by mouth daily as needed for diarrhea or loose stools. ?   ?magnesium hydroxide 400 MG/5ML suspension ?Commonly known as: MILK OF MAGNESIA ?Take 30 mLs by mouth daily as needed for mild constipation. ?   ?Menthol-Zinc Oxide 0.44-20.625 % Oint ?Apply 1 application topically 3 (three) times daily. Each shift and any incontinence episode. ?   ?neomycin-bacitracin-polymyxin [phone] ointment ?Apply 1 application. topically as needed (for minor skin tears or abrasions). ?   ?Nutritional Shake Liqd ?Take 1 Can by mouth 3 (three) times daily. ?   ?oxymetazoline 0.05 % nasal spray ?Commonly known as: Afrin Nasal Spray ?Place 1 spray into both nostrils 2 (two) times daily. As needed for sinus congestion.  Do not use for more than 3 days. ?   ?QUEtiapine 25 MG tablet ?Commonly known as: SEROQUEL ?Take 12.5 mg by mouth at bedtime. ?   ?rivastigmine 1.5 MG capsule ?Commonly known as: EXELON ?Take 1.5 mg by mouth 2 (two) times daily with a meal.  ? ? ?Relevant Imaging Results: ? ?Relevant Lab  Results: ? ? ?Additional Information ?SSN [id number] ? ?Ross Ludwig, LCSW ? ? ? ? ?

## 2021-12-19 NOTE — Progress Notes (Signed)
Report called to Lawrence Marseilles at [facility].  All questions answered, no further questions. Family aware of d/c back to facility and agreeable.  ?

## 2021-12-19 NOTE — TOC Transition Note (Addendum)
Transition of Care (TOC) - CM/SW Discharge Note ? ? ?Patient Details  ?Name: Walter [name] ?MRN: [id number] ?Date of Birth: 1951-07-20 ? ?Transition of Care (TOC) CM/SW Contact:  ?Darleene Cleaver, LCSW ?Phone Number: ?12/19/2021, 11:53 AM ? ? ?Clinical Narrative:    ? ?Patient to be d/c'ed today to [facility] memory care ALF.  Patient and family agreeable to plans will transport via ems RN to call report to [employer], room 308B, [phone].  CSW left voice mail for patient's wife.  CSW signing off, please reconsult if other social work needs arise.  Outpatient palliative referral made to [facility]. ? ? ? ? ?Final next level of care: Assisted Living [facility]) ?Barriers to Discharge: Barriers Resolved ? ? ?Patient Goals and CMS Choice ?Patient states their goals for this hospitalization and ongoing recovery are:: To return back to his ALF. ?CMS Medicare.gov Compare Post Acute Care list provided to:: Patient Represenative (must comment) ?Choice offered to / list presented to : Spouse ? ?Discharge Placement ?  ?           ?Patient chooses bed at: [facility] ?Patient to be transferred to facility by: [facility] ?Name of family member notified: Left message on wife Cynthia's voice mail. ?Patient and family notified of of transfer: 12/19/21 ? ?Discharge Plan and Services ?  ?Discharge Planning Services: CM Consult ?Post Acute Care Choice:  ([facility]-memory care)          ?  ?  ?  ?  ?  ?  ?  ?  ?  ?  ? ?Social Determinants of Health (SDOH) Interventions ?  ? ? ?Readmission Risk Interventions ?   ? View : No data to display.  ?  ?  ?  ? ? ? ? ? ?

## 2021-12-20 DIAGNOSIS — F02818 Dementia in other diseases classified elsewhere, unspecified severity, with other behavioral disturbance: Secondary | ICD-10-CM | POA: Diagnosis not present

## 2021-12-20 DIAGNOSIS — R1310 Dysphagia, unspecified: Secondary | ICD-10-CM | POA: Diagnosis not present

## 2021-12-22 DIAGNOSIS — L03116 Cellulitis of left lower limb: Secondary | ICD-10-CM | POA: Diagnosis not present

## 2021-12-22 DIAGNOSIS — Z66 Do not resuscitate: Secondary | ICD-10-CM | POA: Diagnosis not present

## 2021-12-22 DIAGNOSIS — A411 Sepsis due to other specified staphylococcus: Secondary | ICD-10-CM | POA: Diagnosis not present

## 2021-12-23 DIAGNOSIS — F02818 Dementia in other diseases classified elsewhere, unspecified severity, with other behavioral disturbance: Secondary | ICD-10-CM | POA: Diagnosis not present

## 2021-12-23 DIAGNOSIS — R1310 Dysphagia, unspecified: Secondary | ICD-10-CM | POA: Diagnosis not present

## 2021-12-25 DIAGNOSIS — F02818 Dementia in other diseases classified elsewhere, unspecified severity, with other behavioral disturbance: Secondary | ICD-10-CM | POA: Diagnosis not present

## 2021-12-25 DIAGNOSIS — R1310 Dysphagia, unspecified: Secondary | ICD-10-CM | POA: Diagnosis not present

## 2021-12-26 ENCOUNTER — Encounter: Payer: Self-pay | Admitting: Family Medicine

## 2021-12-26 ENCOUNTER — Non-Acute Institutional Stay: Payer: Medicare Other | Admitting: Family Medicine

## 2021-12-26 DIAGNOSIS — Z515 Encounter for palliative care: Secondary | ICD-10-CM | POA: Diagnosis not present

## 2021-12-26 DIAGNOSIS — L03114 Cellulitis of left upper limb: Secondary | ICD-10-CM

## 2021-12-26 DIAGNOSIS — F03918 Unspecified dementia, unspecified severity, with other behavioral disturbance: Secondary | ICD-10-CM | POA: Diagnosis not present
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
## 2021-12-29 DIAGNOSIS — R1310 Dysphagia, unspecified: Secondary | ICD-10-CM | POA: Diagnosis not present

## 2021-12-29 DIAGNOSIS — F02818 Dementia in other diseases classified elsewhere, unspecified severity, with other behavioral disturbance: Secondary | ICD-10-CM | POA: Diagnosis not present

## 2021-12-30 DIAGNOSIS — R1310 Dysphagia, unspecified: Secondary | ICD-10-CM | POA: Diagnosis not present

## 2021-12-30 DIAGNOSIS — F02818 Dementia in other diseases classified elsewhere, unspecified severity, with other behavioral disturbance: Secondary | ICD-10-CM | POA: Diagnosis not present

## 2022-01-06 DIAGNOSIS — F02818 Dementia in other diseases classified elsewhere, unspecified severity, with other behavioral disturbance: Secondary | ICD-10-CM | POA: Diagnosis not present

## 2022-01-06 DIAGNOSIS — R1310 Dysphagia, unspecified: Secondary | ICD-10-CM | POA: Diagnosis not present

## 2022-01-08 ENCOUNTER — Encounter (HOSPITAL_COMMUNITY): Payer: Self-pay | Admitting: Oncology

## 2022-01-08 ENCOUNTER — Emergency Department (HOSPITAL_COMMUNITY): Payer: Medicare Other

## 2022-01-08 ENCOUNTER — Other Ambulatory Visit: Payer: Self-pay

## 2022-01-08 ENCOUNTER — Emergency Department (HOSPITAL_COMMUNITY)
Admission: EM | Admit: 2022-01-08 | Discharge: 2022-01-09 | Disposition: A | Discharge status: Home / Self Care | Payer: Medicare Other | Attending: Emergency Medicine | Admitting: Emergency Medicine

## 2022-01-08 DIAGNOSIS — F0390 Unspecified dementia without behavioral disturbance: Secondary | ICD-10-CM | POA: Diagnosis not present

## 2022-01-08 DIAGNOSIS — R7989 Other specified abnormal findings of blood chemistry: Secondary | ICD-10-CM | POA: Insufficient documentation

## 2022-01-08 DIAGNOSIS — Z20822 Contact with and (suspected) exposure to covid-19: Secondary | ICD-10-CM | POA: Diagnosis not present

## 2022-01-08 DIAGNOSIS — R0902 Hypoxemia: Secondary | ICD-10-CM | POA: Diagnosis not present

## 2022-01-08 DIAGNOSIS — I499 Cardiac arrhythmia, unspecified: Secondary | ICD-10-CM | POA: Diagnosis not present

## 2022-01-08 DIAGNOSIS — K625 Hemorrhage of anus and rectum: Secondary | ICD-10-CM | POA: Diagnosis not present

## 2022-01-08 DIAGNOSIS — K769 Liver disease, unspecified: Secondary | ICD-10-CM | POA: Diagnosis not present

## 2022-01-08 DIAGNOSIS — R9431 Abnormal electrocardiogram [ECG] [EKG]: Secondary | ICD-10-CM | POA: Diagnosis not present

## 2022-01-08 DIAGNOSIS — R1084 Generalized abdominal pain: Secondary | ICD-10-CM | POA: Insufficient documentation

## 2022-01-08 DIAGNOSIS — R531 Weakness: Secondary | ICD-10-CM | POA: Diagnosis not present

## 2022-01-08 DIAGNOSIS — R58 Hemorrhage, not elsewhere classified: Secondary | ICD-10-CM | POA: Diagnosis not present

## 2022-01-08 DIAGNOSIS — R109 Unspecified abdominal pain: Secondary | ICD-10-CM | POA: Diagnosis not present

## 2022-01-08 DIAGNOSIS — Z743 Need for continuous supervision: Secondary | ICD-10-CM | POA: Diagnosis not present

## 2022-01-08 LAB — URINALYSIS, ROUTINE W REFLEX MICROSCOPIC
Bilirubin Urine: NEGATIVE
Glucose, UA: NEGATIVE mg/dL
Hgb urine dipstick: NEGATIVE
Ketones, ur: NEGATIVE mg/dL
Leukocytes,Ua: NEGATIVE
Nitrite: NEGATIVE
Protein, ur: 30 mg/dL — AB
Specific Gravity, Urine: >1.046 — ABNORMAL HIGH (ref 1.005–1.030)
pH: 5 (ref 5.0–8.0)

## 2022-01-08 LAB — COMPREHENSIVE METABOLIC PANEL
ALT: 21 U/L (ref 0–44)
AST: 26 U/L (ref 15–41)
Albumin: 3.6 g/dL (ref 3.5–5.0)
Alkaline Phosphatase: 76 U/L (ref 38–126)
Anion gap: 8 (ref 5–15)
BUN: 31 mg/dL — ABNORMAL HIGH (ref 8–23)
CO2: 30 mmol/L (ref 22–32)
Calcium: 8.9 mg/dL (ref 8.9–10.3)
Chloride: 102 mmol/L (ref 98–111)
Creatinine, Ser: 0.83 mg/dL (ref 0.61–1.24)
GFR, Estimated: >60 mL/min (ref >60)
Glucose, Bld: 102 mg/dL — ABNORMAL HIGH (ref 70–99)
Potassium: 3.9 mmol/L (ref 3.5–5.1)
Sodium: 140 mmol/L (ref 135–145)
Total Bilirubin: 0.9 mg/dL (ref 0.3–1.2)
Total Protein: 7.7 g/dL (ref 6.5–8.1)

## 2022-01-08 LAB — CBC WITH DIFFERENTIAL/PLATELET
Abs Immature Granulocytes: 0.03 10*3/uL (ref 0.00–0.07)
Basophils Absolute: 0 10*3/uL (ref 0.0–0.1)
Basophils Relative: 0 %
Eosinophils Absolute: 0 10*3/uL (ref 0.0–0.5)
Eosinophils Relative: 0 %
HCT: 41.6 % (ref 39.0–52.0)
Hemoglobin: 14.3 g/dL (ref 13.0–17.0)
Immature Granulocytes: 0 %
Lymphocytes Relative: 24 %
Lymphs Abs: 1.8 10*3/uL (ref 0.7–4.0)
MCH: 32.4 pg (ref 26.0–34.0)
MCHC: 34.4 g/dL (ref 30.0–36.0)
MCV: 94.1 fL (ref 80.0–100.0)
Monocytes Absolute: 0.9 10*3/uL (ref 0.1–1.0)
Monocytes Relative: 13 %
Neutro Abs: 4.7 10*3/uL (ref 1.7–7.7)
Neutrophils Relative %: 63 %
Platelets: 148 10*3/uL — ABNORMAL LOW (ref 150–400)
RBC: 4.42 MIL/uL (ref 4.22–5.81)
RDW: 13.1 % (ref 11.5–15.5)
WBC: 7.5 10*3/uL (ref 4.0–10.5)
nRBC: 0 % (ref 0.0–0.2)

## 2022-01-08 LAB — RESP PANEL BY RT-PCR (FLU A&B, COVID) ARPGX2
Influenza A by PCR: NEGATIVE
Influenza B by PCR: NEGATIVE
SARS Coronavirus 2 by RT PCR: NEGATIVE

## 2022-01-08 LAB — POC OCCULT BLOOD, ED: Fecal Occult Bld: POSITIVE — AB

## 2022-01-08 LAB — LACTIC ACID, PLASMA
Lactic Acid, Venous: 2.2 mmol/L (ref 0.5–1.9)
Lactic Acid, Venous: 0.9 mmol/L (ref 0.5–1.9)

## 2022-01-08 LAB — I-STAT CHEM 8, ED
BUN: 30 mg/dL — ABNORMAL HIGH (ref 8–23)
Calcium, Ion: 1.17 mmol/L (ref 1.15–1.40)
Chloride: 98 mmol/L (ref 98–111)
Creatinine, Ser: 0.7 mg/dL (ref 0.61–1.24)
Glucose, Bld: 106 mg/dL — ABNORMAL HIGH (ref 70–99)
HCT: 40 % (ref 39.0–52.0)
Hemoglobin: 13.6 g/dL (ref 13.0–17.0)
Potassium: 3.9 mmol/L (ref 3.5–5.1)
Sodium: 139 mmol/L (ref 135–145)
TCO2: 31 mmol/L (ref 22–32)

## 2022-01-08 MED ORDER — PANTOPRAZOLE SODIUM 40 MG PO TBEC: 40.0000 mg | DELAYED_RELEASE_TABLET | Freq: Every day | ORAL | 1 refills | Status: AC

## 2022-01-08 MED ORDER — SODIUM CHLORIDE 0.9 % IV BOLUS
1000.0000 mL | Freq: Once | INTRAVENOUS | Status: AC
  Administered 2022-01-08: 1000 mL via INTRAVENOUS

## 2022-01-08 MED ORDER — IOHEXOL 300 MG/ML  SOLN
100.0000 mL | Freq: Once | INTRAMUSCULAR | Status: AC | PRN
  Administered 2022-01-08: 100 mL via INTRAVENOUS

## 2022-01-08 MED ORDER — PANTOPRAZOLE SODIUM 40 MG IV SOLR
40.0000 mg | Freq: Once | INTRAVENOUS | Status: AC
  Administered 2022-01-08: 40 mg via INTRAVENOUS
  Filled 2022-01-08: qty 10

## 2022-01-08 MED ORDER — SODIUM CHLORIDE 0.9 % IV SOLN
2.0000 g | Freq: Once | INTRAVENOUS | Status: AC
  Administered 2022-01-08: 2 g via INTRAVENOUS
  Filled 2022-01-08: qty 20

## 2022-01-08 NOTE — Discharge Instructions (Addendum)
Follow-up with [facility].   Return if any problem ?

## 2022-01-08 NOTE — ED Notes (Addendum)
[facility] called for transport. Patient is fifth in line for transport to pick up. ?

## 2022-01-08 NOTE — ED Provider Notes (Signed)
?[facility]-EMERGENCY DEPT ?Provider Note ? ? ?CSN: [id number] ?Arrival date & time: 01/08/22  1511 ? ?  ? ?History ? ?Chief Complaint  ?Patient presents with  ? GI Bleeding  ? ? ?[name] a 71 y.o. male. ? ?Patient had some rectal bleeding at the nursing home.  Patient has dementia and is a DNR ? ?The history is provided by the nursing home. No language interpreter was used.  ?Rectal Bleeding ?Quality:  Bright red ?Amount:  Moderate ?Timing:  Intermittent ?Chronicity:  New ?Context: not anal fissures   ?Similar prior episodes: no   ?Relieved by:  Nothing ?Worsened by:  Nothing ?Associated symptoms: no abdominal pain   ?Risk factors: no anticoagulant use   ? ?  ? ?Home Medications ?Prior to Admission medications   ?Medication Sig Start Date End Date Taking? Authorizing Provider  ?pantoprazole (PROTONIX) 40 MG tablet Take 1 tablet (40 mg total) by mouth daily. 01/08/22  Yes Bethann BerkshireZammit, Alissah Redmon, MD  ?acetaminophen (TYLENOL) 500 MG tablet Take 500 mg by mouth every 6 (six) hours as needed for mild pain, fever or headache.    [provider]  ?alum & mag hydroxide-simeth (MAALOX/MYLANTA) 200-200-20 MG/5ML suspension Take 30 mLs by mouth every 6 (six) hours as needed for indigestion or heartburn.    [provider]  ?divalproex (DEPAKOTE SPRINKLE) 125 MG capsule Take 125 mg by mouth daily. At 2 PM FOR SUNDOWNING BEHAVIORS 10/24/21   [provider]  ?doxycycline (VIBRA-TABS) 100 MG tablet Take 1 tablet (100 mg total) by mouth every 12 (twelve) hours. 12/19/21   Marinda ElkFeliz Ortiz, Abraham, MD  ?erythromycin ophthalmic ointment Place a 1/2 inch ribbon of ointment into the lower eyelid every 4 hours. ?Patient not taking: Reported on 12/14/2021 11/09/21   Jacalyn LefevreHaviland, Julie, MD  ?guaiFENesin (ROBITUSSIN) 100 MG/5ML liquid Take 10 mLs by mouth every 6 (six) hours as needed for cough or to loosen phlegm.    [provider]  ?HYDROcodone-acetaminophen (NORCO/VICODIN) 5-325 MG tablet  Take 1 tablet by mouth every 4 (four) hours as needed. 12/19/21   Marinda ElkFeliz Ortiz, Abraham, MD  ?loperamide (IMODIUM) 2 MG capsule Take 2 mg by mouth daily as needed for diarrhea or loose stools.    [provider]  ?magnesium hydroxide (MILK OF MAGNESIA) 400 MG/5ML suspension Take 30 mLs by mouth daily as needed for mild constipation.    [provider]  ?Menthol-Zinc Oxide 0.44-20.625 % OINT Apply 1 application topically 3 (three) times daily. Each shift and any incontinence episode. 10/06/21   [provider]  ?neomycin-bacitracin-polymyxin (NEOSPORIN) [phone] ointment Apply 1 application. topically as needed (for minor skin tears or abrasions).    [provider]  ?Nutritional Supplements (NUTRITIONAL SHAKE) LIQD Take 1 Can by mouth 3 (three) times daily.    [provider]  ?oxymetazoline (AFRIN NASAL SPRAY) 0.05 % nasal spray Place 1 spray into both nostrils 2 (two) times daily. As needed for sinus congestion.  Do not use for more than 3 days. ?Patient not taking: Reported on 12/14/2021 11/09/21   Jacalyn LefevreHaviland, Julie, MD  ?QUEtiapine (SEROQUEL) 25 MG tablet Take 12.5 mg by mouth at bedtime. 09/10/21 12/15/22  [provider]  ?rivastigmine (EXELON) 1.5 MG capsule Take 1.5 mg by mouth 2 (two) times daily with a meal. 08/29/20   [provider]  ?   ? ?Allergies    ?Antihistamines, chlorpheniramine-type   ? ?Review of Systems   ?Review of Systems  ?Unable to perform ROS: Mental status  change  ?Gastrointestinal:  Positive for hematochezia. Negative for abdominal pain.  ? ?Physical Exam ?Updated Vital Signs ?BP 132/83   Pulse 97   Temp 100.1 ?F (37.8 ?C) (Rectal)   Resp 15   SpO2 100%  ?Physical Exam ?Vitals and nursing note reviewed.  ?Constitutional:   ?   Appearance: He is well-developed.  ?HENT:  ?   Head: Normocephalic.  ?   Nose: Nose normal.  ?Eyes:  ?   General: No scleral icterus. ?   Conjunctiva/sclera: Conjunctivae normal.  ?Neck:  ?   Thyroid: No  thyromegaly.  ?Cardiovascular:  ?   Rate and Rhythm: Normal rate and regular rhythm.  ?   Heart sounds: No murmur heard. ?  No friction rub. No gallop.  ?Pulmonary:  ?   Breath sounds: No stridor. No wheezing or rales.  ?Chest:  ?   Chest wall: No tenderness.  ?Abdominal:  ?   General: There is no distension.  ?   Tenderness: There is no abdominal tenderness. There is no rebound.  ?Genitourinary: ?   Comments: Brown stool heme positive ?Musculoskeletal:     ?   General: Normal range of motion.  ?   Cervical back: Neck supple.  ?Lymphadenopathy:  ?   Cervical: No cervical adenopathy.  ?Skin: ?   Findings: No erythema or rash.  ?Neurological:  ?   Mental Status: He is alert.  ?   Motor: No abnormal muscle tone.  ?   Coordination: Coordination normal.  ?Psychiatric:     ?   Behavior: Behavior normal.  ? ? ?ED Results / Procedures / Treatments   ?Labs ?(all labs ordered are listed, but only abnormal results are displayed) ?Labs Reviewed  ?COMPREHENSIVE METABOLIC PANEL - Abnormal; Notable for the following components:  ?    Result Value  ? Glucose, Bld 102 (*)   ? BUN 31 (*)   ? All other components within normal limits  ?CBC WITH DIFFERENTIAL/PLATELET - Abnormal; Notable for the following components:  ? Platelets 148 (*)   ? All other components within normal limits  ?URINALYSIS, ROUTINE W REFLEX MICROSCOPIC - Abnormal; Notable for the following components:  ? Specific Gravity, Urine >1.046 (*)   ? Protein, ur 30 (*)   ? Bacteria, UA RARE (*)   ? All other components within normal limits  ?LACTIC ACID, PLASMA - Abnormal; Notable for the following components:  ? Lactic Acid, Venous 2.2 (*)   ? All other components within normal limits  ?I-STAT CHEM 8, ED - Abnormal; Notable for the following components:  ? BUN 30 (*)   ? Glucose, Bld 106 (*)   ? All other components within normal limits  ?POC OCCULT BLOOD, ED - Abnormal; Notable for the following components:  ? Fecal Occult Bld POSITIVE (*)   ? All other components  within normal limits  ?RESP PANEL BY RT-PCR (FLU A&B, COVID) ARPGX2  ?URINE CULTURE  ?LACTIC ACID, PLASMA  ? ? ?EKG ?None ? ?Radiology ?CT ABDOMEN PELVIS W CONTRAST ? ?Result Date: 01/08/2022 ?CLINICAL DATA:  Acute generalized abdominal pain EXAM: CT ABDOMEN AND PELVIS WITH CONTRAST TECHNIQUE: Multidetector CT imaging of the abdomen and pelvis was performed using the standard protocol following bolus administration of intravenous contrast. RADIATION DOSE REDUCTION: This exam was performed according to the departmental dose-optimization program which includes automated exposure control, adjustment of the mA and/or kV according to patient size and/or use of iterative reconstruction technique. CONTRAST:  OMNIPAQUE IOHEXOL 300 MG/ML  SOLN  COMPARISON:  None. FINDINGS: Lower chest: Nonspecific 6 mm ground-glass attenuation airspace opacity in the left lower lobe (image 14 series 6). Mild focal scarring with bleb formation in the medial aspect of the left lower lobe. No focal airspace infiltrate. The heart is normal in size. Calcifications visualized along the coronary arteries. Unremarkable esophagus. Hepatobiliary: Multifocal circumscribed low-attenuation liver lesions consistent with simple cysts. The largest measures up to 4.4 cm. No intra or extrahepatic biliary ductal dilatation. Stones are present within the gallbladder. Pancreas: Unremarkable. No pancreatic ductal dilatation or surrounding inflammatory changes. Spleen: No splenic injury or perisplenic hematoma. Adrenals/Urinary Tract: Normal adrenal glands. Multifocal nephrolithiasis in the lower pole of the right kidney without evidence of obstruction. The largest stone measures 9 mm. No evidence of hydronephrosis or enhancing renal mass. Circumscribed low-attenuation lesion in the anterior interpolar left kidney is too small to characterize but statistically likely a benign cyst. Ureters and bladder are unremarkable. Stomach/Bowel: Stomach is within normal  limits. No evidence of bowel wall thickening, distention, or inflammatory changes. Vascular/Lymphatic: Scattered atherosclerotic vascular calcifications. No aneurysm or dissection. No suspicious lymphadenopathy.

## 2022-01-08 NOTE — ED Triage Notes (Signed)
Pt bib [facility] from [facility] d/t rectal bleeding seen by staff while cleaning pt after a BM.  ?

## 2022-01-09 DIAGNOSIS — Z7401 Bed confinement status: Secondary | ICD-10-CM | POA: Diagnosis not present

## 2022-01-09 DIAGNOSIS — R531 Weakness: Secondary | ICD-10-CM | POA: Diagnosis not present

## 2022-01-09 DIAGNOSIS — F02818 Dementia in other diseases classified elsewhere, unspecified severity, with other behavioral disturbance: Secondary | ICD-10-CM | POA: Diagnosis not present

## 2022-01-09 DIAGNOSIS — R1310 Dysphagia, unspecified: Secondary | ICD-10-CM | POA: Diagnosis not present

## 2022-01-09 LAB — URINE CULTURE: Culture: NO GROWTH

## 2022-01-09 NOTE — ED Notes (Signed)
Spoke with Marisue Ivan at [facility] to let them know pt was just picked up by [facility] and is heading their direction now. Report was given earlier by myself to [place], med tech. ?

## 2022-01-12 DIAGNOSIS — Z66 Do not resuscitate: Secondary | ICD-10-CM | POA: Diagnosis not present

## 2022-01-12 DIAGNOSIS — K922 Gastrointestinal hemorrhage, unspecified: Secondary | ICD-10-CM | POA: Diagnosis not present

## 2022-01-12 DIAGNOSIS — Z87440 Personal history of urinary (tract) infections: Secondary | ICD-10-CM | POA: Diagnosis not present

## 2022-01-12 DIAGNOSIS — R627 Adult failure to thrive: Secondary | ICD-10-CM | POA: Diagnosis not present

## 2022-01-13 DIAGNOSIS — F02818 Dementia in other diseases classified elsewhere, unspecified severity, with other behavioral disturbance: Secondary | ICD-10-CM | POA: Diagnosis not present

## 2022-01-13 DIAGNOSIS — R1310 Dysphagia, unspecified: Secondary | ICD-10-CM | POA: Diagnosis not present

## 2022-05-04 DIAGNOSIS — Z66 Do not resuscitate: Secondary | ICD-10-CM | POA: Diagnosis not present

## 2022-06-02 DIAGNOSIS — Z66 Do not resuscitate: Secondary | ICD-10-CM | POA: Diagnosis not present

## 2022-06-02 DIAGNOSIS — U071 COVID-19: Secondary | ICD-10-CM | POA: Diagnosis not present

## 2023-03-24 IMAGING — US US SOFT TISSUE HEAD/NECK
1 series · 11 of 11 positions shown · non-contrast
Comparison: None.

CLINICAL DATA: Enlarged posterior right lymph node. Cervical
lymphadenopathy RHS.S ([id number]-CM)

EXAM:
ULTRASOUND OF HEAD/NECK SOFT TISSUES
TECHNIQUE: Ultrasound examination of the head and neck soft tissues was
performed in the area of clinical concern.

[Series 1: us soft tissue head/neck · 0.07mm/px · 11 acquisitions, 11 frames shown]
[im 1/11]
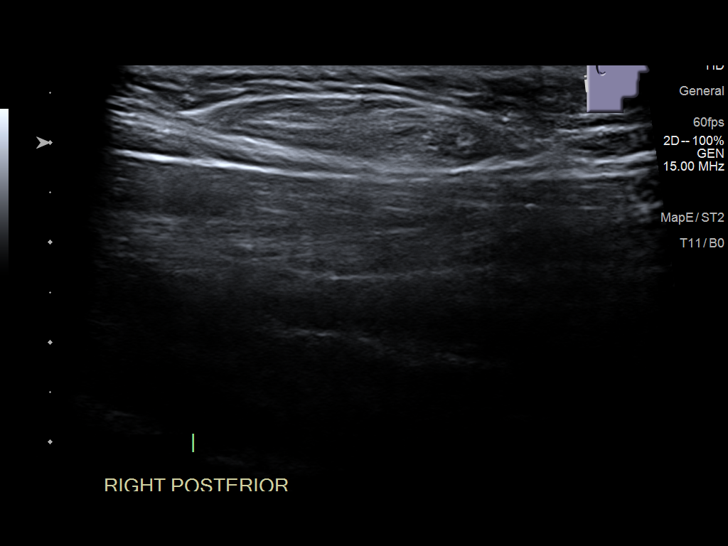
[im 2/11]
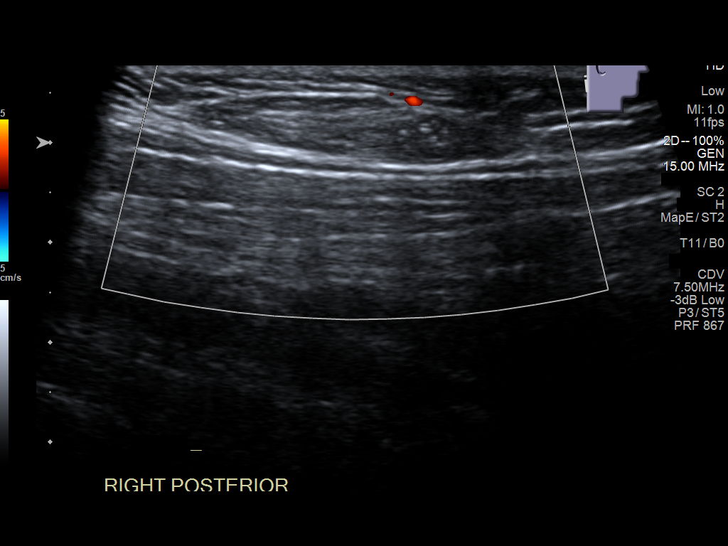
[im 3/11]
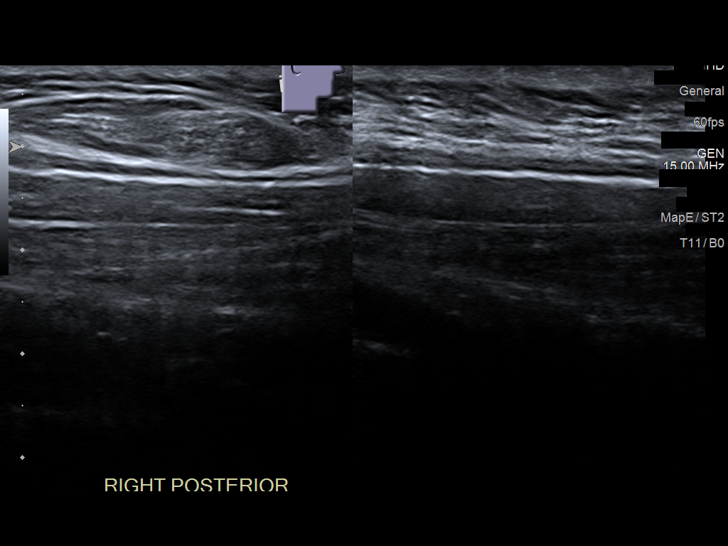
[im 4/11]
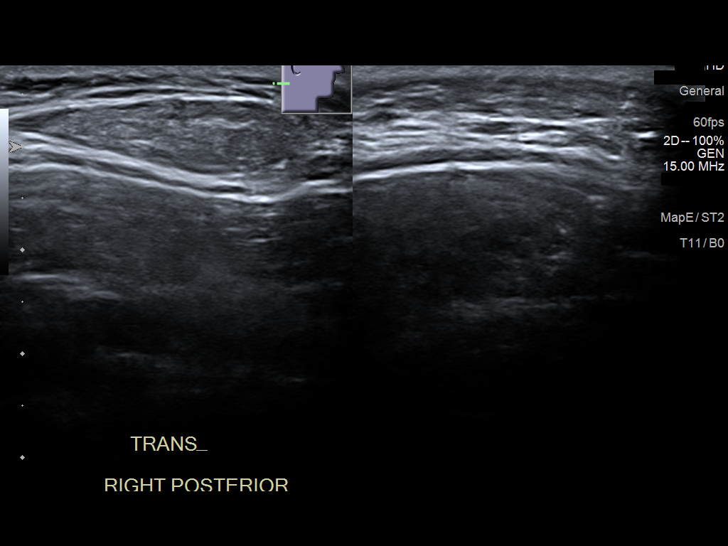
[im 5/11]
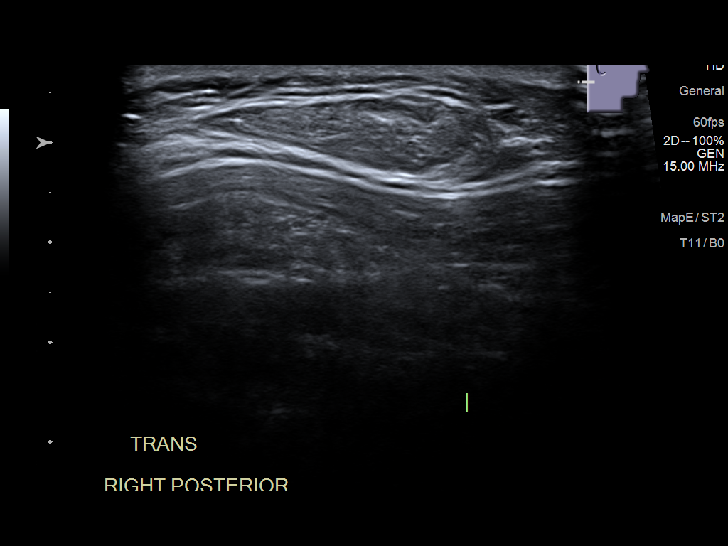
[im 6/11]
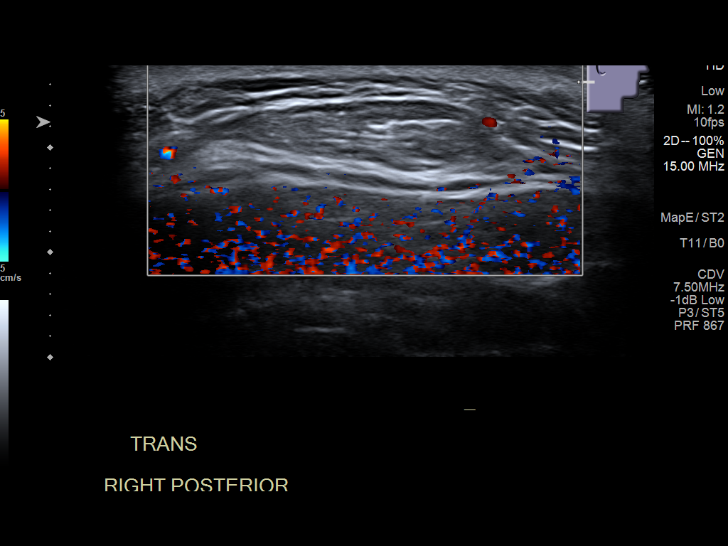
[im 7/11]
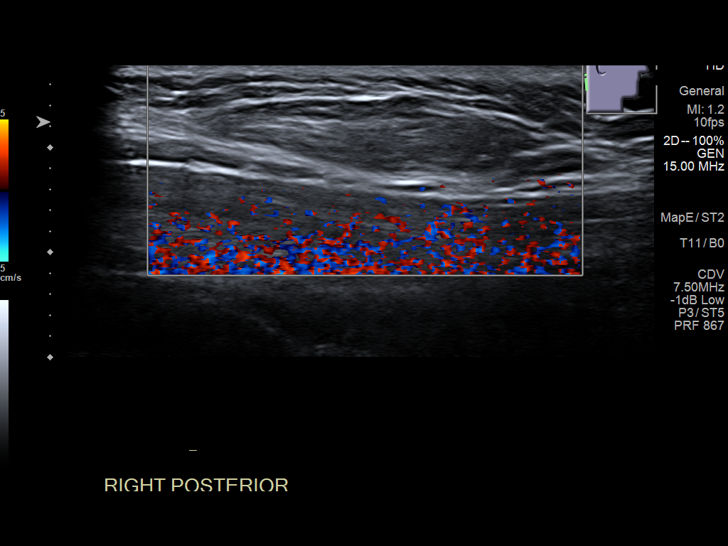
[im 8/11]
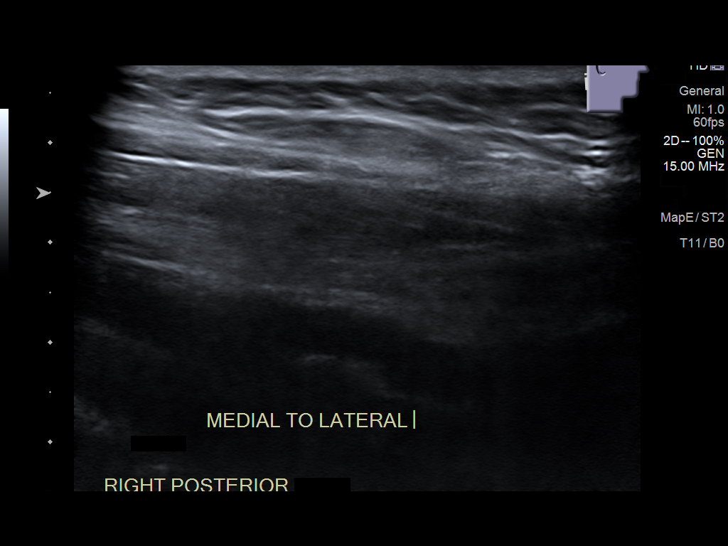
[im 9/11]
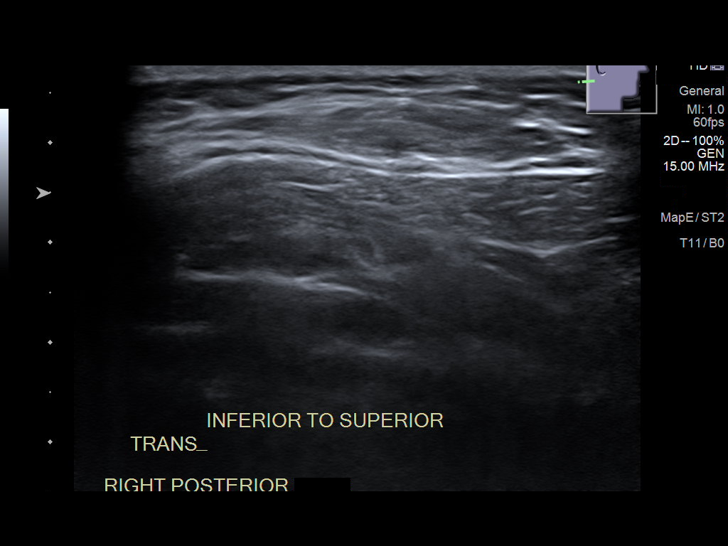
[im 10/11]
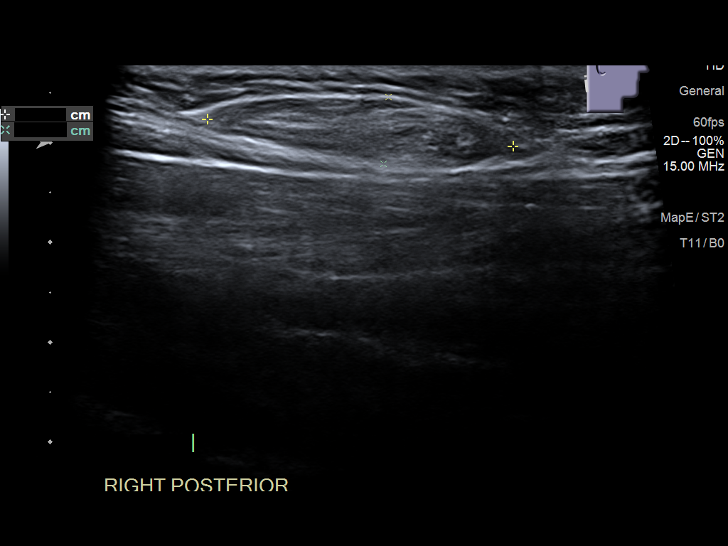
[im 11/11]
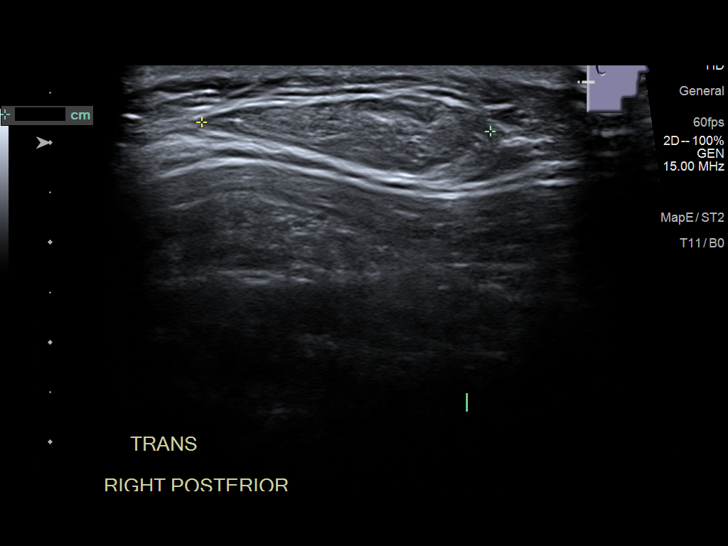

[11 of 11 positions shown; findings below may reference images not displayed]

FINDINGS: Within the posterior soft tissues at the neck, there is a palpable
mass that measures 3.1 x 0.7 x 2.9 cm. The mass has heterogeneous
echotexture most similar to surrounding fat. There is no internal
vascularity or increased blood flow. No fluid collection.
IMPRESSION: Palpable mass of the posterior neck is most likely a lipoma. MRI of
the soft tissues of the neck would likely be confirmatory.

## 2023-07-24 IMAGING — DX DG ELBOW COMPLETE 3+V*L*
4 series · 4 of 4 positions shown · non-contrast
Comparison: None.

CLINICAL DATA: Possible sepsis. Insect bite with redness and
swelling.

EXAM:
LEFT ELBOW - COMPLETE 3+ VIEW

[elbow ap]
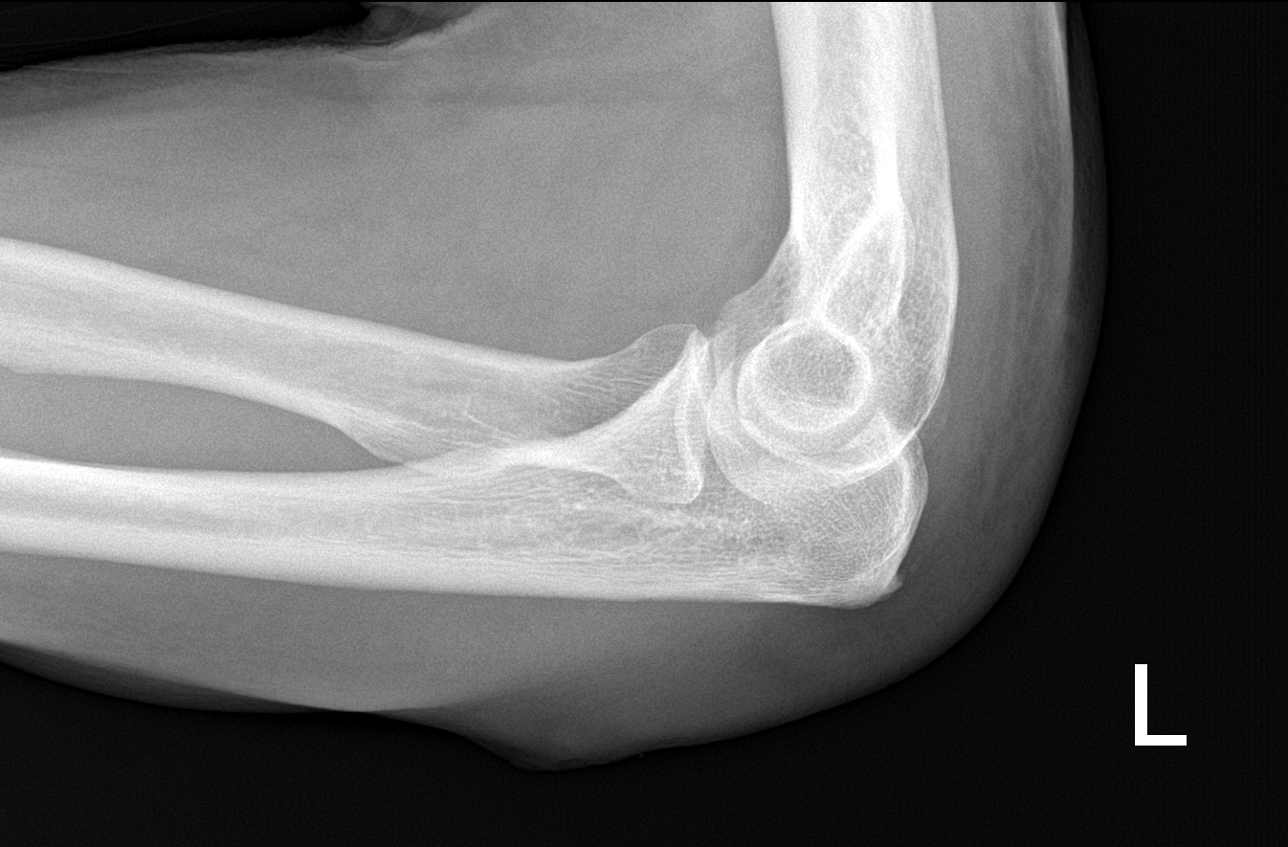

[elbow obl (1 of 2)]
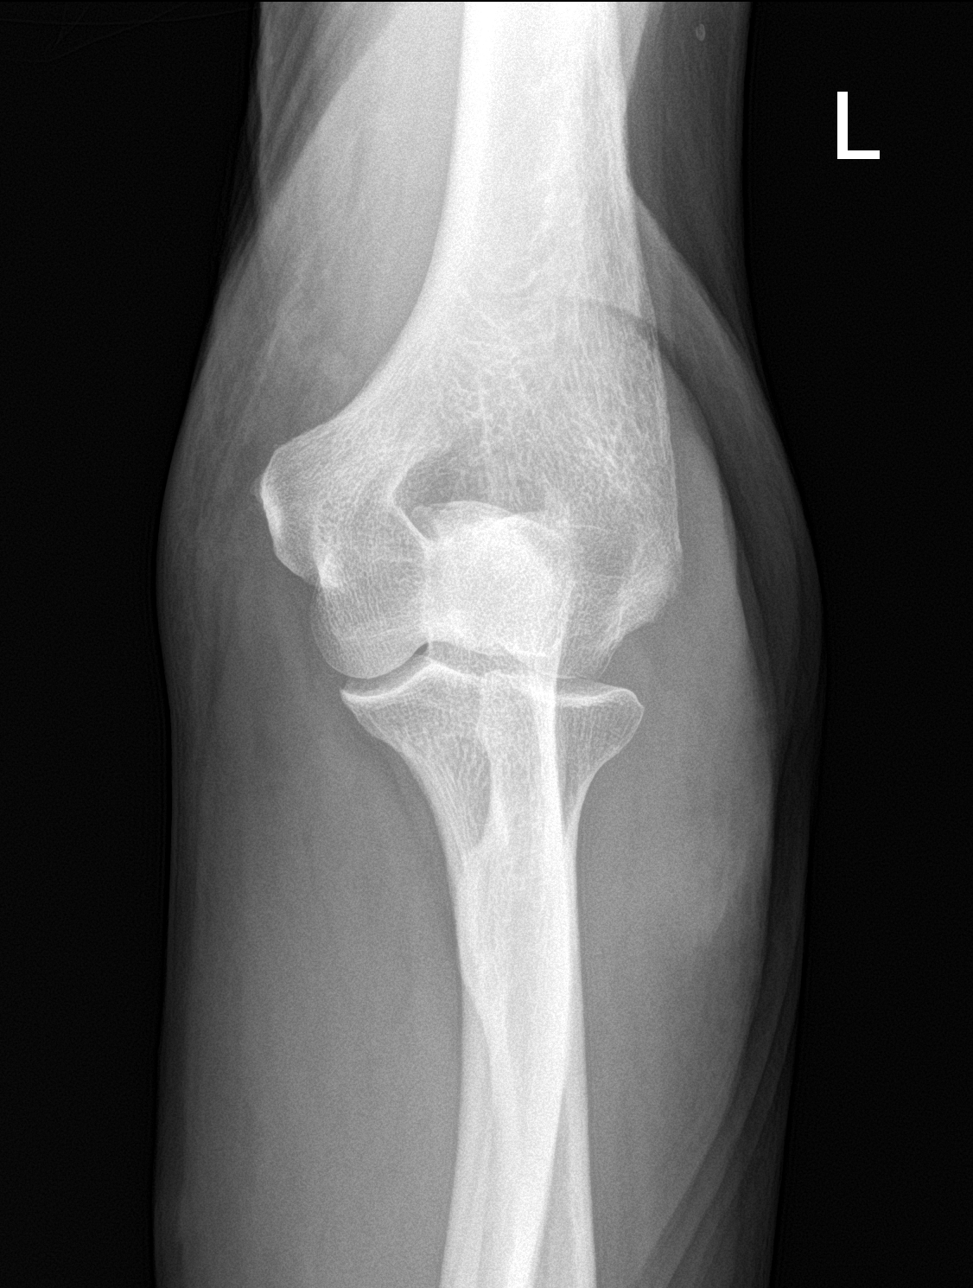

[elbow obl (2 of 2)]
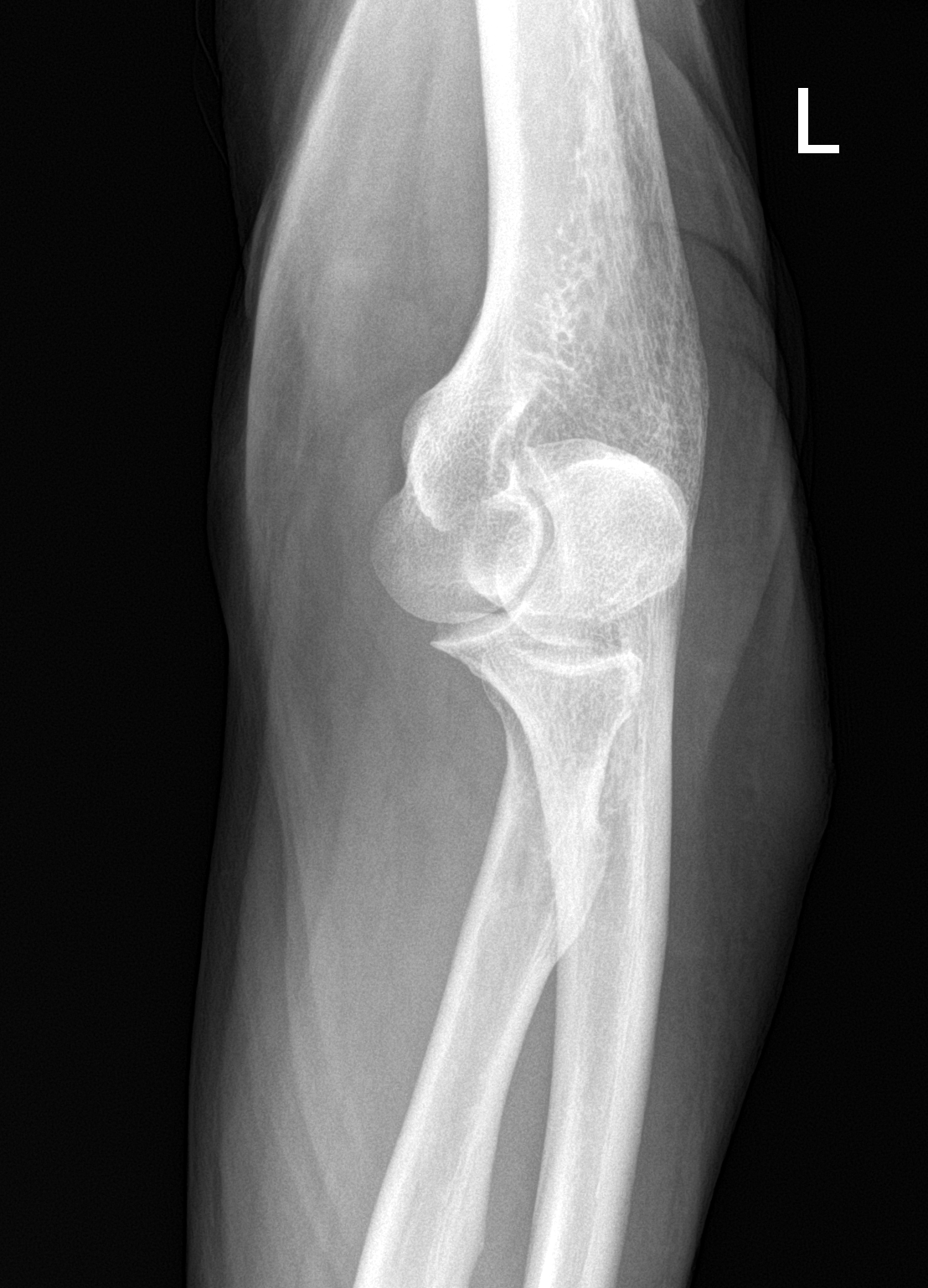

[elbow lat]
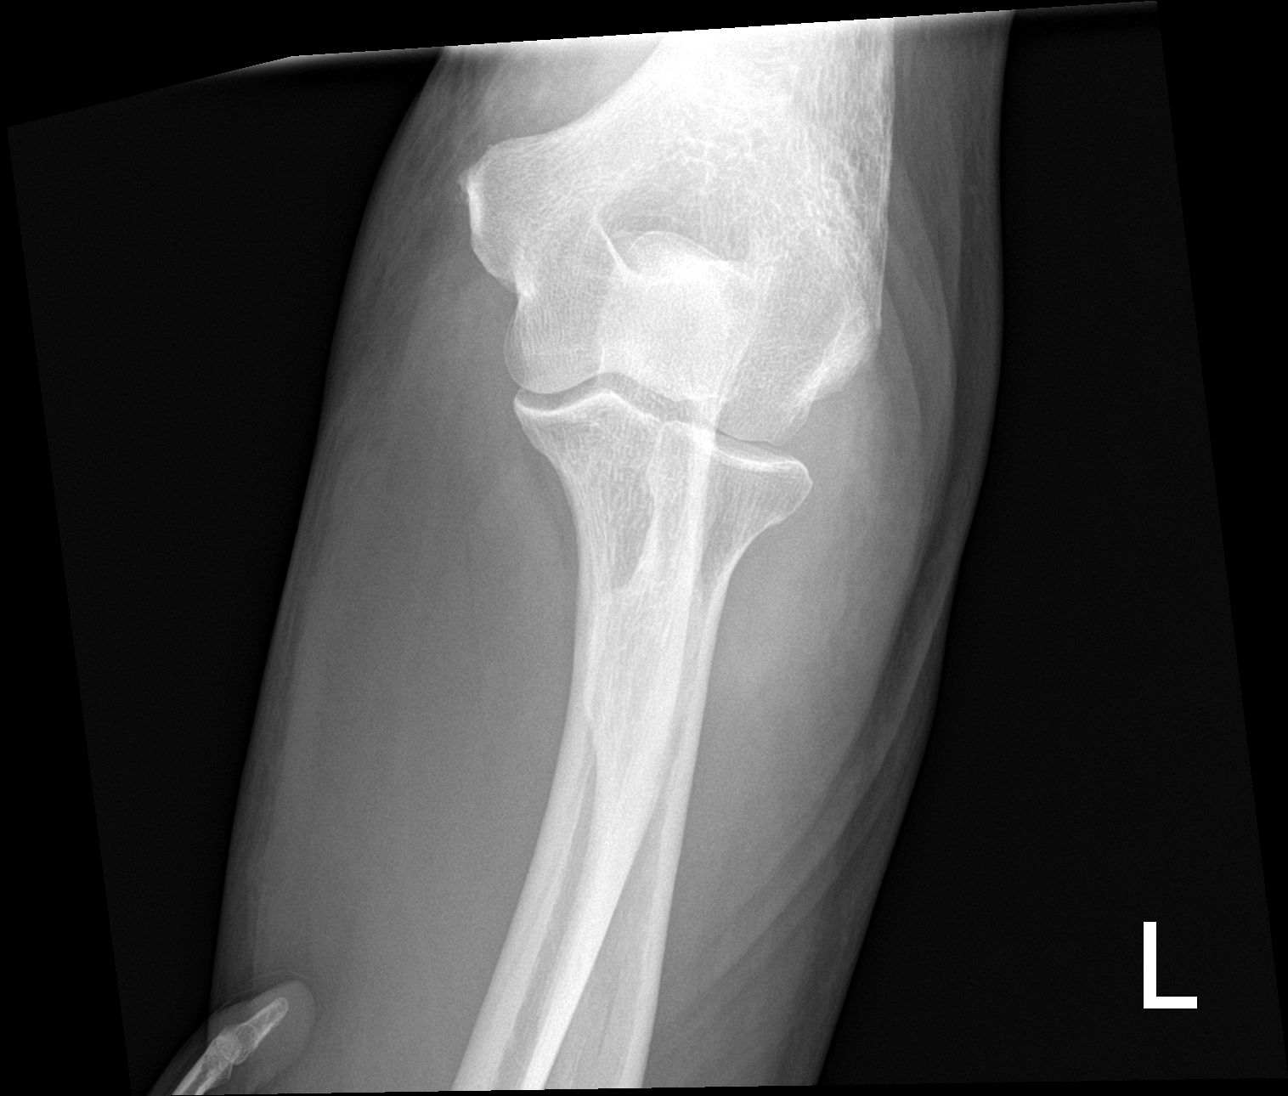

[4 of 4 positions shown; findings below may reference images not displayed]

FINDINGS: There is no evidence of fracture, dislocation, or joint effusion.
Mild spurring is noted at the olecranon. Enthesopathic changes are
noted at the medial epicondyle. Soft tissue swelling is present
about the elbow. No radiopaque foreign body is identified.
IMPRESSION: No acute osseous abnormality or radiopaque foreign body.

## 2023-07-24 IMAGING — DX DG CHEST 1V PORT
1 series · 1 of 1 positions shown · non-contrast
Comparison: Chest x-ray 06/25/2011.

CLINICAL DATA: Questionable sepsis.

EXAM:
PORTABLE CHEST 1 VIEW

[chest ap]
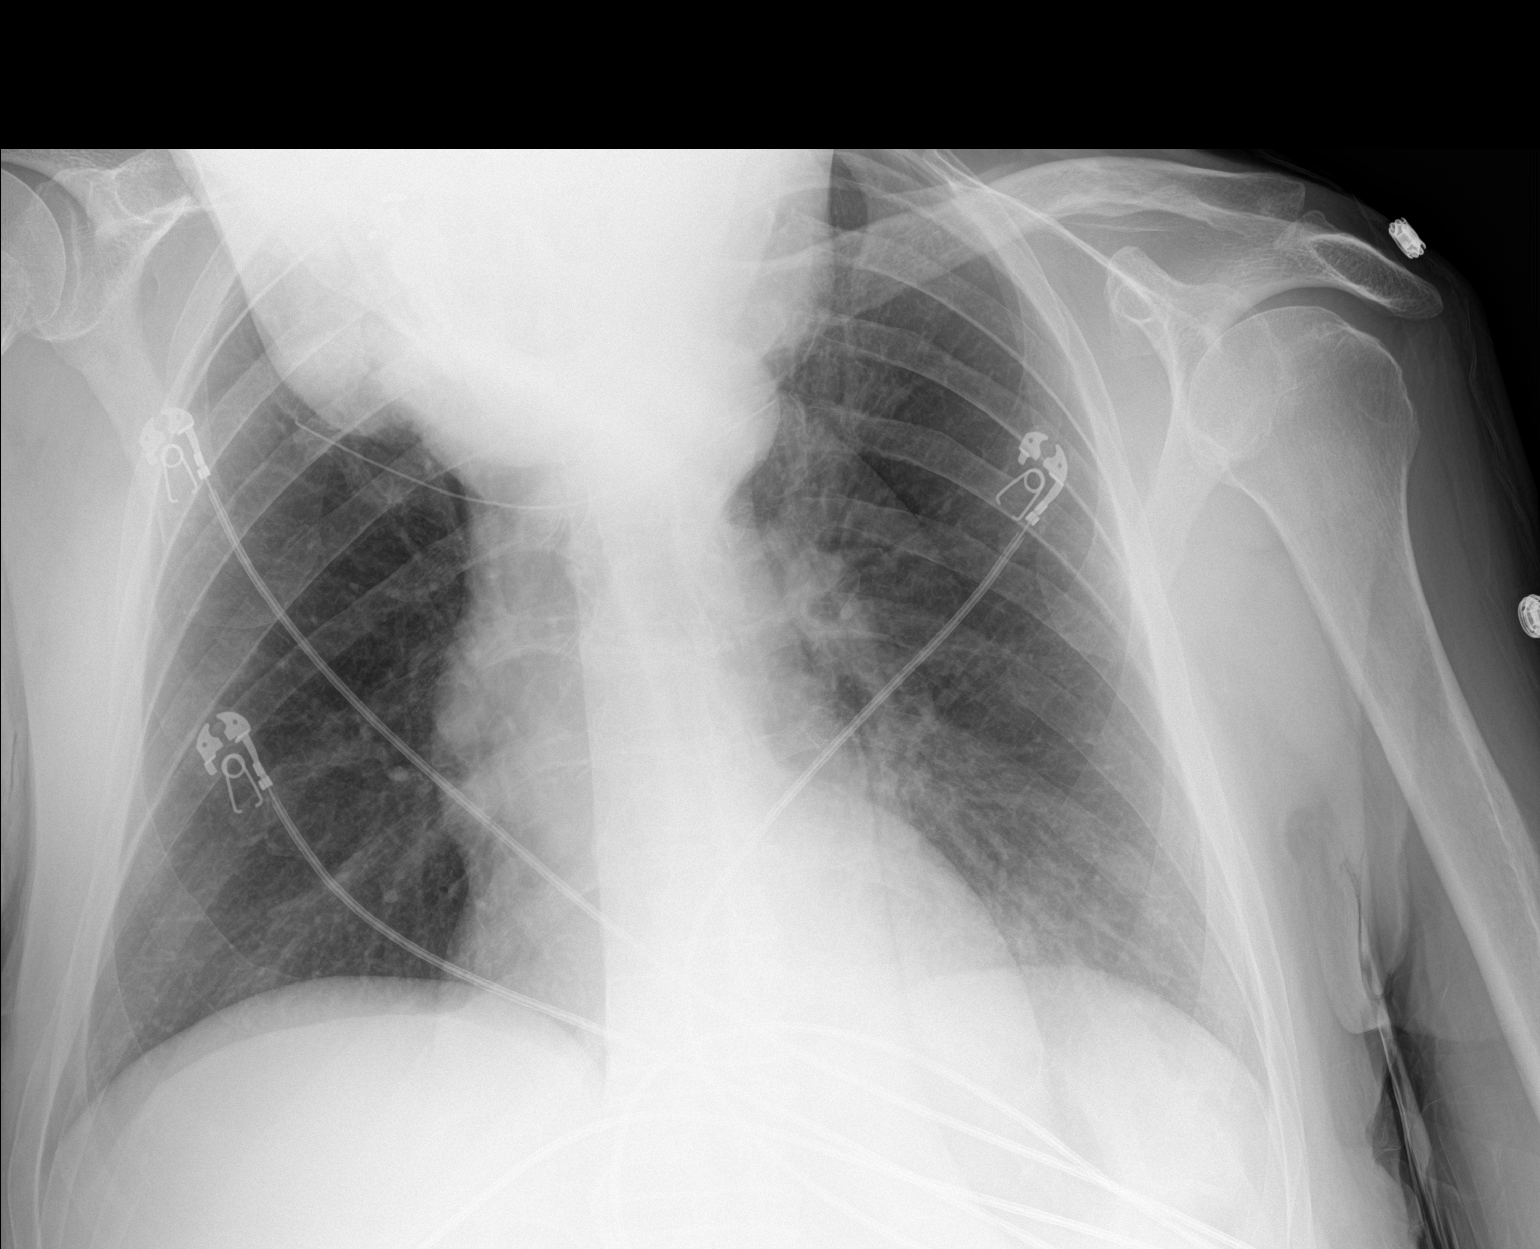

[1 of 1 positions shown; findings below may reference images not displayed]

FINDINGS: The heart size and mediastinal contours are within normal limits.
Both lungs are clear. The visualized skeletal structures are
unremarkable.
IMPRESSION: No active disease.

## 2023-09-29 DEATH — deceased
